# Patient Record
Sex: Male | Born: 1975 | Race: Black or African American | Hispanic: No | Marital: Married | Smoking: Never smoker
Health system: Southern US, Community
[De-identification: ages and names within clinical notes are randomized; demographics above are authoritative.]

## PROBLEM LIST (undated history)

## (undated) DIAGNOSIS — Z940 Kidney transplant status: Secondary | ICD-10-CM

## (undated) DIAGNOSIS — D696 Thrombocytopenia, unspecified: Secondary | ICD-10-CM

## (undated) DIAGNOSIS — N186 End stage renal disease: Secondary | ICD-10-CM

## (undated) DIAGNOSIS — I10 Essential (primary) hypertension: Secondary | ICD-10-CM

## (undated) DIAGNOSIS — Z992 Dependence on renal dialysis: Secondary | ICD-10-CM

## (undated) DIAGNOSIS — I770 Arteriovenous fistula, acquired: Secondary | ICD-10-CM

## (undated) DIAGNOSIS — M9080 Osteopathy in diseases classified elsewhere, unspecified site: Secondary | ICD-10-CM

## (undated) DIAGNOSIS — N189 Chronic kidney disease, unspecified: Secondary | ICD-10-CM

## (undated) DIAGNOSIS — N19 Unspecified kidney failure: Secondary | ICD-10-CM

## (undated) DIAGNOSIS — D631 Anemia in chronic kidney disease: Secondary | ICD-10-CM

## (undated) DIAGNOSIS — Z973 Presence of spectacles and contact lenses: Secondary | ICD-10-CM

## (undated) DIAGNOSIS — N039 Chronic nephritic syndrome with unspecified morphologic changes: Secondary | ICD-10-CM

## (undated) DIAGNOSIS — M898X9 Other specified disorders of bone, unspecified site: Secondary | ICD-10-CM

## (undated) DIAGNOSIS — E213 Hyperparathyroidism, unspecified: Secondary | ICD-10-CM

## (undated) DIAGNOSIS — N185 Chronic kidney disease, stage 5: Secondary | ICD-10-CM

## (undated) DIAGNOSIS — E889 Metabolic disorder, unspecified: Secondary | ICD-10-CM

## (undated) HISTORY — DX: Thrombocytopenia, unspecified: D69.6

## (undated) HISTORY — DX: Chronic nephritic syndrome with unspecified morphologic changes: N03.9

## (undated) HISTORY — DX: Anemia in chronic kidney disease: D63.1

## (undated) HISTORY — DX: Hyperparathyroidism, unspecified: E21.3

## (undated) HISTORY — DX: Unspecified kidney failure: N19

## (undated) HISTORY — DX: Arteriovenous fistula, acquired: I77.0

## (undated) HISTORY — DX: Chronic kidney disease, unspecified: N18.9

## (undated) HISTORY — DX: Essential (primary) hypertension: I10

## (undated) HISTORY — PX: SHOULDER SURGERY: SHX246

## (undated) HISTORY — DX: Kidney transplant status: Z94.0

## (undated) HISTORY — PX: NEPHRECTOMY TRANSPLANTED ORGAN: SUR880

## (undated) HISTORY — DX: Other specified disorders of bone, unspecified site: M89.8X9

## (undated) HISTORY — DX: Chronic kidney disease, stage 5: N18.5

## (undated) HISTORY — DX: End stage renal disease: N18.6

## (undated) HISTORY — DX: Osteopathy in diseases classified elsewhere, unspecified site: M90.80

## (undated) HISTORY — PX: WISDOM TOOTH EXTRACTION: SHX21

## (undated) HISTORY — DX: Metabolic disorder, unspecified: E88.9

## (undated) HISTORY — DX: Dependence on renal dialysis: Z99.2

---

## 2003-02-27 ENCOUNTER — Emergency Department (HOSPITAL_COMMUNITY): Admission: EM | Admit: 2003-02-27 | Discharge: 2003-02-28 | Payer: Self-pay | Admitting: Emergency Medicine

## 2006-08-24 ENCOUNTER — Encounter: Admission: RE | Admit: 2006-08-24 | Discharge: 2006-08-24 | Payer: Self-pay | Admitting: Family Medicine

## 2009-02-26 ENCOUNTER — Encounter: Admission: RE | Admit: 2009-02-26 | Discharge: 2009-02-26 | Payer: Self-pay | Admitting: Nephrology

## 2009-05-03 ENCOUNTER — Encounter: Payer: Self-pay | Admitting: Cardiovascular Disease

## 2009-05-03 ENCOUNTER — Ambulatory Visit: Payer: Self-pay

## 2009-05-03 DIAGNOSIS — N185 Chronic kidney disease, stage 5: Secondary | ICD-10-CM | POA: Insufficient documentation

## 2009-05-03 HISTORY — DX: Chronic kidney disease, stage 5: N18.5

## 2009-11-01 ENCOUNTER — Ambulatory Visit: Payer: Self-pay | Admitting: Surgery

## 2010-04-10 ENCOUNTER — Encounter: Payer: Self-pay | Admitting: Family Medicine

## 2010-04-21 NOTE — Miscellaneous (Signed)
Summary: Orders Update  Clinical Lists Changes  Problems: Added new problem of RENAL DISEASE, CHRONIC, STAGE IV (ICD-585.4) Orders: Added new Test order of Renal Artery Duplex (Renal Artery Duplex) - Signed

## 2010-08-02 NOTE — Procedures (Signed)
CEPHALIC VEIN MAPPING   INDICATION:  End-stage renal disease.   HISTORY:   EXAM:  The right cephalic vein is compressible.   Diameter measurements range from 0.27 cm to 0.39 cm.   The right basilic vein is compressible.   Diameter measurements range from 0.36 to 0.52 cm.   The left cephalic vein is compressible.   Diameter measurements range from 0.36 cm to 0.52 cm.   The left basilic vein is compressible.   Diameter measurements range from 0.30 cm to 0.54 cm.   See attached worksheet for all measurements.   IMPRESSION:  Patient's bilateral cephalic veins and basilic veins are  noted with the diameters above.   ___________________________________________  V. Leia Alf, MD   CB/MEDQ  D:  11/01/2009  T:  11/01/2009  Job:  [id number]

## 2010-08-02 NOTE — Assessment & Plan Note (Signed)
OFFICE VISIT   Charles [name], Charles [name]  DOB:  [birth year]                                       11/01/2009  [id number]   REASON FOR VISIT:  Discussion for dialysis access.   HISTORY:  This a 35 year old gentleman I have seen at the request of Dr.  Posey Pronto for evaluation of dialysis access.  Patient has stage IV kidney  disease secondary to hypertension.  Renal complications have been  metabolic bone disease as well as anemia, both of which are medically  managed.  The patient is right-handed.   REVIEW OF SYSTEMS:  GENERAL:  Negative chills, weight gain, weight loss.  VASCULAR:  Negative.  CARDIAC:  Negative.  GI:  Negative.  NEURO:  Negative.  PULMONARY:  Negative.  HEME:  Negative.  GU:  Positive for renal disease.  ENT:  Negative.  MUSCULOSKELETAL:  Negative.  LYMPHATICS:  Negative.  SKIN:  Negative.   PAST MEDICAL HISTORY:  Hypertension, chronic kidney disease, metabolic  bone disease and anemia.   PAST SURGICAL HISTORY:  Shoulder surgery in 1992 and 19/94.   SOCIAL HISTORY:  He is married with 4 children.  Works in [employer], research (medical).  He does not smoke or drink.   FAMILY HISTORY:  Negative for cardiovascular disease.   ALLERGIES:  Verapamil.   PHYSICAL EXAMINATION:  Heart rate is 52, blood pressure 188/112,  temperature is 97.5.  General:  Well-appearing in no distress.  HEENT:  Within normal limits.  Respirations nonlabored.  Cardiovascular:  Patient has a palpable left radial pulse.  MUSCULOSKELETAL:  Without  major deformity.  Neurologic:  No focal deficits or weakness.  Skin:  Without rash.   DIAGNOSTICS:  I have ordered and independently reviewed his vein  mapping.  He has excellent cephalic and basilic veins bilaterally.   ASSESSMENT/PLAN:  Chronic kidney disease needing permanent access.  I  discussed at length today proceeding with left radiocephalic fistula.  We talked about nonmaturity.  We talked about the risk of steal  syndrome.  The patient  wishes to go back to Dr. Posey Pronto, about the timing  of which his operation needs to occur.  He is under the understanding  that at 20% function that he can go many years at his age without  requiring dialysis.  I told I would be happy to assist with him in any  way I could.  He will need to call my office, after his meeting with Dr.  Posey Pronto to schedule either a follow-up or left radiocephalic fistula.     Eldridge Abrahams, MD  Electronically Signed   VWB/MEDQ  D:  11/01/2009  T:  11/02/2009  Job:  [id number]   cc:   Elmarie Shiley, MD

## 2011-06-29 ENCOUNTER — Other Ambulatory Visit: Payer: Self-pay

## 2011-06-29 DIAGNOSIS — N185 Chronic kidney disease, stage 5: Secondary | ICD-10-CM

## 2011-06-29 DIAGNOSIS — Z01810 Encounter for preprocedural cardiovascular examination: Secondary | ICD-10-CM

## 2011-06-29 DIAGNOSIS — N184 Chronic kidney disease, stage 4 (severe): Secondary | ICD-10-CM

## 2011-07-04 ENCOUNTER — Ambulatory Visit (HOSPITAL_COMMUNITY): Admission: RE | Admit: 2011-07-04 | Payer: Self-pay | Source: Ambulatory Visit

## 2011-07-04 ENCOUNTER — Ambulatory Visit (HOSPITAL_COMMUNITY): Admission: RE | Admit: 2011-07-04 | Payer: BC Managed Care – PPO | Source: Ambulatory Visit

## 2011-07-12 ENCOUNTER — Encounter: Payer: Self-pay | Admitting: Surgery

## 2011-07-20 ENCOUNTER — Encounter: Payer: Self-pay | Admitting: Surgery

## 2011-07-24 ENCOUNTER — Ambulatory Visit: Payer: BC Managed Care – PPO | Admitting: Surgery

## 2011-07-30 ENCOUNTER — Inpatient Hospital Stay (HOSPITAL_COMMUNITY)
Admission: EM | Admit: 2011-07-30 | Discharge: 2011-08-03 | DRG: 315 | Disposition: A | Discharge status: Home / Self Care | Payer: BC Managed Care – PPO | Attending: Internal Medicine | Admitting: Internal Medicine

## 2011-07-30 ENCOUNTER — Encounter (HOSPITAL_COMMUNITY): Payer: Self-pay

## 2011-07-30 DIAGNOSIS — Z9119 Patient's noncompliance with other medical treatment and regimen: Secondary | ICD-10-CM

## 2011-07-30 DIAGNOSIS — N2581 Secondary hyperparathyroidism of renal origin: Secondary | ICD-10-CM | POA: Diagnosis present

## 2011-07-30 DIAGNOSIS — N19 Unspecified kidney failure: Secondary | ICD-10-CM

## 2011-07-30 DIAGNOSIS — M9080 Osteopathy in diseases classified elsewhere, unspecified site: Secondary | ICD-10-CM

## 2011-07-30 DIAGNOSIS — N185 Chronic kidney disease, stage 5: Secondary | ICD-10-CM | POA: Diagnosis present

## 2011-07-30 DIAGNOSIS — D696 Thrombocytopenia, unspecified: Secondary | ICD-10-CM

## 2011-07-30 DIAGNOSIS — I120 Hypertensive chronic kidney disease with stage 5 chronic kidney disease or end stage renal disease: Principal | ICD-10-CM | POA: Diagnosis present

## 2011-07-30 DIAGNOSIS — N186 End stage renal disease: Secondary | ICD-10-CM

## 2011-07-30 DIAGNOSIS — Z888 Allergy status to other drugs, medicaments and biological substances status: Secondary | ICD-10-CM

## 2011-07-30 DIAGNOSIS — E872 Acidosis, unspecified: Secondary | ICD-10-CM

## 2011-07-30 DIAGNOSIS — Z833 Family history of diabetes mellitus: Secondary | ICD-10-CM

## 2011-07-30 DIAGNOSIS — Z91199 Patient's noncompliance with other medical treatment and regimen due to unspecified reason: Secondary | ICD-10-CM

## 2011-07-30 DIAGNOSIS — Z8249 Family history of ischemic heart disease and other diseases of the circulatory system: Secondary | ICD-10-CM

## 2011-07-30 DIAGNOSIS — E889 Metabolic disorder, unspecified: Secondary | ICD-10-CM

## 2011-07-30 DIAGNOSIS — N189 Chronic kidney disease, unspecified: Secondary | ICD-10-CM

## 2011-07-30 DIAGNOSIS — D631 Anemia in chronic kidney disease: Secondary | ICD-10-CM | POA: Diagnosis present

## 2011-07-30 DIAGNOSIS — M898X9 Other specified disorders of bone, unspecified site: Secondary | ICD-10-CM

## 2011-07-30 DIAGNOSIS — I10 Essential (primary) hypertension: Secondary | ICD-10-CM | POA: Diagnosis present

## 2011-07-30 DIAGNOSIS — E8729 Other acidosis: Secondary | ICD-10-CM

## 2011-07-30 DIAGNOSIS — N184 Chronic kidney disease, stage 4 (severe): Secondary | ICD-10-CM

## 2011-07-30 HISTORY — DX: Anemia in chronic kidney disease: D63.1

## 2011-07-30 HISTORY — DX: Anemia in chronic kidney disease: N18.9

## 2011-07-30 HISTORY — DX: Unspecified kidney failure: N19

## 2011-07-30 LAB — CBC
HCT: 35.4 % — ABNORMAL LOW (ref 39.0–52.0)
Hemoglobin: 12.8 g/dL — ABNORMAL LOW (ref 13.0–17.0)
MCH: 29.2 pg (ref 26.0–34.0)
MCHC: 36.2 g/dL — ABNORMAL HIGH (ref 30.0–36.0)
MCV: 80.8 fL (ref 78.0–100.0)
Platelets: 94 10*3/uL — ABNORMAL LOW (ref 150–400)
RBC: 4.38 MIL/uL (ref 4.22–5.81)
RDW: 13.1 % (ref 11.5–15.5)
WBC: 12.2 10*3/uL — ABNORMAL HIGH (ref 4.0–10.5)

## 2011-07-30 LAB — URINALYSIS, ROUTINE W REFLEX MICROSCOPIC
Bilirubin Urine: NEGATIVE
Glucose, UA: NEGATIVE mg/dL
Ketones, ur: NEGATIVE mg/dL
Leukocytes, UA: NEGATIVE
Nitrite: NEGATIVE
Protein, ur: 100 mg/dL — AB
Specific Gravity, Urine: 1.015 (ref 1.005–1.030)
Urobilinogen, UA: 0.2 mg/dL (ref 0.0–1.0)
pH: 5 (ref 5.0–8.0)

## 2011-07-30 LAB — DIFFERENTIAL
Basophils Absolute: 0 10*3/uL (ref 0.0–0.1)
Basophils Relative: 0 % (ref 0–1)
Eosinophils Absolute: 0.1 10*3/uL (ref 0.0–0.7)
Eosinophils Relative: 1 % (ref 0–5)
Lymphocytes Relative: 12 % (ref 12–46)
Lymphs Abs: 1.5 10*3/uL (ref 0.7–4.0)
Monocytes Absolute: 1.1 10*3/uL — ABNORMAL HIGH (ref 0.1–1.0)
Monocytes Relative: 9 % (ref 3–12)
Neutro Abs: 9.5 10*3/uL — ABNORMAL HIGH (ref 1.7–7.7)
Neutrophils Relative %: 78 % — ABNORMAL HIGH (ref 43–77)

## 2011-07-30 LAB — COMPREHENSIVE METABOLIC PANEL
ALT: 8 U/L (ref 0–53)
AST: 16 U/L (ref 0–37)
Albumin: 3 g/dL — ABNORMAL LOW (ref 3.5–5.2)
Alkaline Phosphatase: 60 U/L (ref 39–117)
BUN: 117 mg/dL — ABNORMAL HIGH (ref 6–23)
CO2: 16 mEq/L — ABNORMAL LOW (ref 19–32)
Calcium: 8.3 mg/dL — ABNORMAL LOW (ref 8.4–10.5)
Chloride: 100 mEq/L (ref 96–112)
Creatinine, Ser: 10.23 mg/dL — ABNORMAL HIGH (ref 0.50–1.35)
GFR calc Af Amer: 7 mL/min — ABNORMAL LOW (ref >90)
GFR calc non Af Amer: 6 mL/min — ABNORMAL LOW (ref >90)
Glucose, Bld: 92 mg/dL (ref 70–99)
Potassium: 3.9 mEq/L (ref 3.5–5.1)
Sodium: 134 mEq/L — ABNORMAL LOW (ref 135–145)
Total Bilirubin: 0.5 mg/dL (ref 0.3–1.2)
Total Protein: 6 g/dL (ref 6.0–8.3)

## 2011-07-30 LAB — URINE MICROSCOPIC-ADD ON

## 2011-07-30 LAB — MRSA PCR SCREENING: MRSA by PCR: NEGATIVE

## 2011-07-30 LAB — LIPASE, BLOOD: Lipase: 82 U/L — ABNORMAL HIGH (ref 11–59)

## 2011-07-30 MED ORDER — TORSEMIDE 20 MG PO TABS
20.0000 mg | ORAL_TABLET | Freq: Every day | ORAL | Status: DC
  Administered 2011-07-30 – 2011-07-31 (×2): 20 mg via ORAL
  Filled 2011-07-30 (×3): qty 1

## 2011-07-30 MED ORDER — CLONIDINE HCL 0.2 MG PO TABS
0.2000 mg | ORAL_TABLET | Freq: Two times a day (BID) | ORAL | Status: DC
  Administered 2011-07-30 – 2011-07-31 (×3): 0.2 mg via ORAL
  Filled 2011-07-30 (×5): qty 1

## 2011-07-30 MED ORDER — ACETAMINOPHEN 325 MG PO TABS
650.0000 mg | ORAL_TABLET | Freq: Four times a day (QID) | ORAL | Status: DC | PRN
  Administered 2011-07-31: 650 mg via ORAL
  Filled 2011-07-30: qty 2

## 2011-07-30 MED ORDER — OMEGA-3 FATTY ACIDS 1000 MG PO CAPS: 1.0000 g | ORAL_CAPSULE | Freq: Every day | ORAL | Status: DC

## 2011-07-30 MED ORDER — HEPARIN SODIUM (PORCINE) 5000 UNIT/ML IJ SOLN
5000.0000 [IU] | Freq: Three times a day (TID) | INTRAMUSCULAR | Status: DC
  Administered 2011-07-30 – 2011-08-02 (×7): 5000 [IU] via SUBCUTANEOUS
  Filled 2011-07-30 (×15): qty 1

## 2011-07-30 MED ORDER — AMLODIPINE BESYLATE 10 MG PO TABS
10.0000 mg | ORAL_TABLET | Freq: Every day | ORAL | Status: DC
  Administered 2011-07-30 – 2011-08-02 (×4): 10 mg via ORAL
  Filled 2011-07-30 (×5): qty 1

## 2011-07-30 MED ORDER — MORPHINE SULFATE 4 MG/ML IJ SOLN
4.0000 mg | Freq: Once | INTRAMUSCULAR | Status: AC
  Administered 2011-07-30: 4 mg via INTRAVENOUS
  Filled 2011-07-30: qty 1

## 2011-07-30 MED ORDER — CALCIUM CARBONATE-VITAMIN D 600-200 MG-UNIT PO TABS: 1.0000 | ORAL_TABLET | Freq: Every day | ORAL | Status: DC

## 2011-07-30 MED ORDER — NEBIVOLOL HCL 20 MG PO TABS: 20.0000 mg | ORAL_TABLET | Freq: Every day | ORAL | Status: DC

## 2011-07-30 MED ORDER — HYDRALAZINE HCL 20 MG/ML IJ SOLN
10.0000 mg | INTRAMUSCULAR | Status: DC | PRN
  Filled 2011-07-30: qty 0.5

## 2011-07-30 MED ORDER — ONDANSETRON HCL 4 MG/2ML IJ SOLN: 4.0000 mg | Freq: Four times a day (QID) | INTRAMUSCULAR | Status: DC | PRN

## 2011-07-30 MED ORDER — NEBIVOLOL HCL 10 MG PO TABS
20.0000 mg | ORAL_TABLET | Freq: Every day | ORAL | Status: DC
  Filled 2011-07-30: qty 2

## 2011-07-30 MED ORDER — AMLODIPINE BESYLATE 10 MG PO TABS
10.0000 mg | ORAL_TABLET | Freq: Every day | ORAL | Status: DC
  Filled 2011-07-30: qty 1

## 2011-07-30 MED ORDER — RENA-VITE PO TABS
1.0000 | ORAL_TABLET | Freq: Every day | ORAL | Status: DC
  Administered 2011-07-30 – 2011-07-31 (×2): 1 via ORAL
  Filled 2011-07-30 (×2): qty 1

## 2011-07-30 MED ORDER — NEBIVOLOL HCL 10 MG PO TABS
20.0000 mg | ORAL_TABLET | Freq: Every day | ORAL | Status: DC
  Administered 2011-07-30 – 2011-08-02 (×4): 20 mg via ORAL
  Filled 2011-07-30 (×6): qty 2

## 2011-07-30 MED ORDER — SODIUM CHLORIDE 0.9 % IV SOLN: Freq: Once | INTRAVENOUS | Status: DC

## 2011-07-30 MED ORDER — FERROUS SULFATE 325 (65 FE) MG PO TABS
325.0000 mg | ORAL_TABLET | Freq: Every day | ORAL | Status: DC
  Filled 2011-07-30: qty 1

## 2011-07-30 MED ORDER — SODIUM CHLORIDE 0.9 % IJ SOLN: 3.0000 mL | INTRAMUSCULAR | Status: DC | PRN

## 2011-07-30 MED ORDER — PARICALCITOL 1 MCG PO CAPS
1.0000 ug | ORAL_CAPSULE | Freq: Every day | ORAL | Status: DC
  Administered 2011-07-30 – 2011-07-31 (×2): 1 ug via ORAL
  Filled 2011-07-30 (×4): qty 1

## 2011-07-30 MED ORDER — MINOXIDIL 10 MG PO TABS
10.0000 mg | ORAL_TABLET | Freq: Two times a day (BID) | ORAL | Status: DC
  Administered 2011-07-30 – 2011-07-31 (×3): 10 mg via ORAL
  Filled 2011-07-30 (×5): qty 1

## 2011-07-30 MED ORDER — SODIUM CHLORIDE 0.9 % IJ SOLN
3.0000 mL | Freq: Two times a day (BID) | INTRAMUSCULAR | Status: DC
  Administered 2011-07-30 – 2011-08-02 (×7): 3 mL via INTRAVENOUS

## 2011-07-30 MED ORDER — ONDANSETRON HCL 4 MG/2ML IJ SOLN
4.0000 mg | Freq: Once | INTRAMUSCULAR | Status: AC
  Administered 2011-07-30: 4 mg via INTRAVENOUS
  Filled 2011-07-30: qty 2

## 2011-07-30 MED ORDER — LABETALOL HCL 5 MG/ML IV SOLN
10.0000 mg | INTRAVENOUS | Status: DC | PRN
  Administered 2011-07-30 (×4): 10 mg via INTRAVENOUS
  Filled 2011-07-30 (×5): qty 4

## 2011-07-30 MED ORDER — VITAMIN D (ERGOCALCIFEROL) 1.25 MG (50000 UNIT) PO CAPS: 50000.0000 [IU] | ORAL_CAPSULE | ORAL | Status: DC

## 2011-07-30 MED ORDER — CALCIUM ACETATE 667 MG PO CAPS
1334.0000 mg | ORAL_CAPSULE | Freq: Three times a day (TID) | ORAL | Status: DC
  Administered 2011-07-31 – 2011-08-03 (×7): 1334 mg via ORAL
  Filled 2011-07-30 (×14): qty 2

## 2011-07-30 MED ORDER — CALCIUM CARBONATE-VITAMIN D 500-200 MG-UNIT PO TABS
1.0000 | ORAL_TABLET | Freq: Every day | ORAL | Status: DC
  Filled 2011-07-30: qty 1

## 2011-07-30 MED ORDER — ONDANSETRON HCL 4 MG PO TABS: 4.0000 mg | ORAL_TABLET | Freq: Four times a day (QID) | ORAL | Status: DC | PRN

## 2011-07-30 MED ORDER — MORPHINE SULFATE 2 MG/ML IJ SOLN
1.0000 mg | INTRAMUSCULAR | Status: DC | PRN
  Administered 2011-07-30: 2 mg via INTRAVENOUS
  Filled 2011-07-30: qty 1

## 2011-07-30 MED ORDER — SODIUM CHLORIDE 0.9 % IV SOLN: 250.0000 mL | INTRAVENOUS | Status: DC | PRN

## 2011-07-30 MED ORDER — SODIUM CHLORIDE 0.9 % IV BOLUS (SEPSIS)
1000.0000 mL | Freq: Once | INTRAVENOUS | Status: AC
  Administered 2011-07-30: 1000 mL via INTRAVENOUS

## 2011-07-30 MED ORDER — OMEGA-3-ACID ETHYL ESTERS 1 G PO CAPS
1.0000 g | ORAL_CAPSULE | Freq: Every day | ORAL | Status: DC
  Administered 2011-07-30 – 2011-08-03 (×5): 1 g via ORAL
  Filled 2011-07-30 (×6): qty 1

## 2011-07-30 MED ORDER — LABETALOL HCL 5 MG/ML IV SOLN
20.0000 mg | Freq: Once | INTRAVENOUS | Status: AC
  Administered 2011-07-30: 20 mg via INTRAVENOUS
  Filled 2011-07-30: qty 4

## 2011-07-30 MED ORDER — IRON PO TABS: 1.0000 | ORAL_TABLET | Freq: Every day | ORAL | Status: DC

## 2011-07-30 MED ORDER — ACETAMINOPHEN 650 MG RE SUPP: 650.0000 mg | Freq: Four times a day (QID) | RECTAL | Status: DC | PRN

## 2011-07-30 MED ORDER — MINOXIDIL 10 MG PO TABS
10.0000 mg | ORAL_TABLET | Freq: Every day | ORAL | Status: DC
  Filled 2011-07-30: qty 1

## 2011-07-30 NOTE — ED Notes (Signed)
Generalized abdominal pain began a few days ago.  Has nausea and has vomited X1,  Denies any hematuria or dysuria.

## 2011-07-30 NOTE — ED Provider Notes (Signed)
History     CSN: [id number]  Arrival date & time 07/30/11  4   First MD Initiated Contact with Patient 07/30/11 1227      Chief Complaint  Patient presents with  . Abdominal Pain  . Hypertension    (Consider location/radiation/quality/duration/timing/severity/associated sxs/prior treatment) HPI History from patient. 36 year old male with history of chronic kidney disease presents with abdominal pain. States it started 3 days ago. Pain is described as dull in nature, is constant, and is located to the epigastrium. No known aggravating/alleviating factors. No treatment at home. He has had associated nausea, vomiting, diarrhea, reduced appetite. Emesis has been nonbilious and nonbloody. Denies fever or chills. Has never had anything like this before. No history of abdominal surgeries. Denies etoh use.  He is followed by Dr. Posey Pronto for his CKD; last Cr was ~7. Not currently on dialysis.  Has hypertension as well. On clonidine, amlodipine, nebivolol. Was only able to take clonidine this morning 2/2 nausea and vomiting. Denies any dizziness, visual changes, headache at this time.  Past Medical History  Diagnosis Date  . Hypertension   . Chronic kidney disease   . Metabolic bone disease   . Anemia in chronic kidney disease     History reviewed. No pertinent past surgical history.  No family history on file.  History  Substance Use Topics  . Smoking status: Never Smoker   . Smokeless tobacco: Not on file  . Alcohol Use: No      Review of Systems  Constitutional: Positive for appetite change. Negative for fever and chills.  Respiratory: Negative for cough and shortness of breath.   Cardiovascular: Negative for chest pain.  Gastrointestinal: Positive for nausea, vomiting, abdominal pain and diarrhea.  Genitourinary: Negative for dysuria and decreased urine volume.  Musculoskeletal: Negative for myalgias.  Skin: Negative for color change and rash.  All other systems  reviewed and are negative.    Allergies  Verapamil  Home Medications   Current Outpatient Rx  Name Route Sig Dispense Refill  . AMLODIPINE BESYLATE 10 MG PO TABS Oral Take 10 mg by mouth daily.    Marland Kitchen CALCIUM 600+D PO Oral Take 1 tablet by mouth daily.     Marland Kitchen CLONIDINE HCL 0.2 MG PO TABS Oral Take 0.2 mg by mouth 2 (two) times daily.    . OMEGA-3 FATTY ACIDS 1000 MG PO CAPS Oral Take 1 g by mouth daily.    Marland Kitchen MINOXIDIL 10 MG PO TABS Oral Take 10 mg by mouth daily.    . NEBIVOLOL HCL 20 MG PO TABS Oral Take 20 mg by mouth daily.     Marland Kitchen PARICALCITOL 1 MCG PO CAPS Oral Take 1 mcg by mouth daily.    . TORSEMIDE 20 MG PO TABS Oral Take 20 mg by mouth daily.    Marland Kitchen VITAMIN D (ERGOCALCIFEROL) 50000 UNITS PO CAPS Oral Take 50,000 Units by mouth every 7 (seven) days. Day of the week varies - last week Tuesday    . IRON PO TABS Oral Take 1 tablet by mouth daily.       BP 225/138  Pulse 94  Temp(Src) 98.2 F (36.8 C) (Oral)  Resp 18  SpO2 100%  Physical Exam  Nursing note and vitals reviewed. Constitutional: He appears well-developed and well-nourished. No distress.       Pt hypertensive; 250/150 on monitor  HENT:  Head: Normocephalic and atraumatic.  Eyes: EOM are normal. Pupils are equal, round, and reactive to light.  Neck: Normal range of  motion.  Cardiovascular: Normal rate, regular rhythm and normal heart sounds.  Exam reveals no gallop and no friction rub.   No murmur heard. Pulmonary/Chest: Effort normal and breath sounds normal. He exhibits no tenderness.  Abdominal: Soft. Bowel sounds are normal.       TTP in epigastrium without rebound or guard  Musculoskeletal: Normal range of motion. He exhibits no edema.  Neurological: He is alert.  Skin: Skin is warm and dry. He is not diaphoretic.  Psychiatric: He has a normal mood and affect.    ED Course  Procedures (including critical care time)  Labs Reviewed  CBC - Abnormal; Notable for the following:    WBC 12.2 (*)     Hemoglobin 12.8 (*)    HCT 35.4 (*)    MCHC 36.2 (*)    Platelets 94 (*) PLATELET COUNT CONFIRMED BY SMEAR   All other components within normal limits  COMPREHENSIVE METABOLIC PANEL - Abnormal; Notable for the following:    Sodium 134 (*)    CO2 16 (*)    BUN 117 (*)    Creatinine, Ser 10.23 (*)    Calcium 8.3 (*)    Albumin 3.0 (*)    GFR calc non Af Amer 6 (*)    GFR calc Af Amer 7 (*)    All other components within normal limits  LIPASE, BLOOD - Abnormal; Notable for the following:    Lipase 82 (*)    All other components within normal limits  URINALYSIS, ROUTINE W REFLEX MICROSCOPIC - Abnormal; Notable for the following:    Hgb urine dipstick MODERATE (*)    Protein, ur 100 (*)    All other components within normal limits  DIFFERENTIAL  URINE MICROSCOPIC-ADD ON   No results found.   1. RENAL DISEASE, CHRONIC, STAGE IV   2. Metabolic acidosis   3. Hypertension       MDM  Patient with chronic kidney disease presents with abdominal pain. Exam significant for mild tenderness to epigastrium. Labs significant for significantly elevated BUN/creatinine above his baseline and metabolic acidosis without anion gap. Lipase mildly elevated. Given this, we'll plan to admit him. Findings discussed with him and he was agreeable with this plan. He is feeling more comfortable at this time with pain medication. I spoke with the resident with [facility], who agrees to see and admit the patient.        Abran Richard, [place] 07/30/11 1356

## 2011-07-30 NOTE — Consult Note (Signed)
Reason for Consult: CKD 5 Referring Physician: Dr. Bonney Aid Charles is an 36 y.o. [name].  HPI: 36 yr old [name].  Has not gotten vasc access placed despite multiple counseling sessions.  Now with N, V, and stomach discomfort.  Admits itching, cramps, but not recent.  Not SOB or CP.  No FH of renal disease.  Admitted with uremia and BP out of control. ROS Denies HA No visual changes or hearing abn Now prob swallowing Has loose stools Skin no rashes No hx Hepatitis No PND or orthop or CP No joint C/O No numbness or tingling or focal weakness No dysuria or hematuria  . Primary Nephrologist Charles [name]. . . Access none.  Past Medical History  Diagnosis Date  . Hypertension   . Chronic kidney disease   . Metabolic bone disease   . Anemia in chronic kidney disease     Past Surgical History  Procedure Date  . Shoulder surgery     20 years ago    Family History  Problem Relation Age of Onset  . Hypertension Mother   . Hypertension Father   . Diabetes Mother     Social History:  reports that he has never smoked. He does not have any smokeless tobacco history on file. He reports that he does not drink alcohol or use illicit drugs.  Allergies:  Allergies  Allergen Reactions  . Verapamil Other (See Comments)    Pt not sure why we have this listed    Medications: I have reviewed the patient's current medications. And made changes anticipating need for HD   Results for orders placed during the hospital encounter of 07/30/11 (from the past 48 hour(s))  CBC     Status: Abnormal   Collection Time   07/30/11 12:44 PM      Component Value Range Comment   WBC 12.2 (*) 4.0 - 10.5 (K/uL)    RBC 4.38  4.22 - 5.81 (MIL/uL)    Hemoglobin 12.8 (*) 13.0 - 17.0 (g/dL)    HCT 35.4 (*) 39.0 - 52.0 (%)    MCV 80.8  78.0 - 100.0 (fL)    MCH 29.2  26.0 - 34.0 (pg)    MCHC 36.2 (*) 30.0 - 36.0 (g/dL)    RDW 13.1  11.5 - 15.5 (%)    Platelets 94 (*)  150 - 400 (K/uL) PLATELET COUNT CONFIRMED BY SMEAR  DIFFERENTIAL     Status: Abnormal   Collection Time   07/30/11 12:44 PM      Component Value Range Comment   Neutrophils Relative 78 (*) 43 - 77 (%)    Lymphocytes Relative 12  12 - 46 (%)    Monocytes Relative 9  3 - 12 (%)    Eosinophils Relative 1  0 - 5 (%)    Basophils Relative 0  0 - 1 (%)    Neutro Abs 9.5 (*) 1.7 - 7.7 (K/uL)    Lymphs Abs 1.5  0.7 - 4.0 (K/uL)    Monocytes Absolute 1.1 (*) 0.1 - 1.0 (K/uL)    Eosinophils Absolute 0.1  0.0 - 0.7 (K/uL)    Basophils Absolute 0.0  0.0 - 0.1 (K/uL)   COMPREHENSIVE METABOLIC PANEL     Status: Abnormal   Collection Time   07/30/11 12:44 PM      Component Value Range Comment   Sodium 134 (*) 135 - 145 (mEq/L)    Potassium 3.9  3.5 - 5.1 (mEq/L)  Chloride 100  96 - 112 (mEq/L)    CO2 16 (*) 19 - 32 (mEq/L)    Glucose, Bld 92  70 - 99 (mg/dL)    BUN 117 (*) 6 - 23 (mg/dL)    Creatinine, Ser 10.23 (*) 0.50 - 1.35 (mg/dL)    Calcium 8.3 (*) 8.4 - 10.5 (mg/dL)    Total Protein 6.0  6.0 - 8.3 (g/dL)    Albumin 3.0 (*) 3.5 - 5.2 (g/dL)    AST 16  0 - 37 (U/L)    ALT 8  0 - 53 (U/L)    Alkaline Phosphatase 60  39 - 117 (U/L)    Total Bilirubin 0.5  0.3 - 1.2 (mg/dL)    GFR calc non Af Amer 6 (*) >90 (mL/min)    GFR calc Af Amer 7 (*) >90 (mL/min)   LIPASE, BLOOD     Status: Abnormal   Collection Time   07/30/11 12:44 PM      Component Value Range Comment   Lipase 82 (*) 11 - 59 (U/L)   URINALYSIS, ROUTINE W REFLEX MICROSCOPIC     Status: Abnormal   Collection Time   07/30/11  1:03 PM      Component Value Range Comment   Color, Urine YELLOW  YELLOW     APPearance CLEAR  CLEAR     Specific Gravity, Urine 1.015  1.005 - 1.030     pH 5.0  5.0 - 8.0     Glucose, UA NEGATIVE  NEGATIVE (mg/dL)    Hgb urine dipstick MODERATE (*) NEGATIVE     Bilirubin Urine NEGATIVE  NEGATIVE     Ketones, ur NEGATIVE  NEGATIVE (mg/dL)    Protein, ur 100 (*) NEGATIVE (mg/dL)    Urobilinogen, UA  0.2  0.0 - 1.0 (mg/dL)    Nitrite NEGATIVE  NEGATIVE     Leukocytes, UA NEGATIVE  NEGATIVE    URINE MICROSCOPIC-ADD ON     Status: Normal   Collection Time   07/30/11  1:03 PM      Component Value Range Comment   RBC / HPF 3-6  <3 (RBC/hpf)     No results found.  @ROS @ Blood pressure 246/141, pulse 77, temperature 98.3 F (36.8 C), temperature source Oral, resp. rate 10, height 5\' 9"  (1.753 m), weight 98.6 kg (217 lb 6 oz), SpO2 98.00%. @PHYSEXAMBYAGE2 @ Physical Examination: General appearance - alert, well appearing, and in no distress and anxious Mental status - alert, oriented to person, place, and time, anxious Eyes - funduscopic exam abnormal silver wiring, av nicking Mouth - mucous membranes moist, pharynx normal without lesions Neck - adenopathy noted PCL Lymphatics - posterior cervical nodes Chest - clear to auscultation, no wheezes, rales or rhonchi, symmetric air entry Heart - normal rate, regular rhythm, normal S1, S2, no murmurs, rubs, clicks or gallops, S1 and S2 normal, S4 present Abdomen - tenderness noted diffusely hepatomegaly Liver down 5 cm Musculoskeletal - no joint tenderness, deformity or swelling Extremities - pedal edema 1 + Skin - dry  Assessment/Plan: 1 CKD 5 with uremia, mild vol xs, HTN.  Needs perm access and Perm cath.  Education done and questions answered  Adherence an issue.  Will get [facility] to see and get both procedures done. Vein map prior 2 HTN time meds for HD.  Lower vol at HD. Cr will rise with control 3 HPTH check leve 4 Anemia mild no tx indic 5 Adherence P Vein map, adjust timing and get meds for HD.  Educate, place access  Charles [name] L 07/30/2011, 7:03 PM

## 2011-07-30 NOTE — H&P (Signed)
Hospital Admission Note Date: 07/30/2011  Patient name: Charles [name] Medical record number: [id number] Date of birth: 10/23/1975 Age: 36 y.o. Gender: male PCP: No primary provider on file.  Medical Service: [facility] teaching service  Attending physician: Dr. Lars Mage    1st Contact: Dr. Nicoletta Dress               [phone] 2nd Contact: Dr. Posey Pronto    [id number]  After 5 pm or weekends: 1st Contact:      Pager: [phone] 2nd Contact:      Pager: [phone]  Chief Complaint: Fatigue  History of Present Illness: This is a 36 year old man with PMH of hypertension, chronic kidney disease, metabolic bone disease and anemia of chronic disease who presents with nausea, vomiting, abdominal pain and fatigue.  Patient states that he started to feel tired/fatigue and lost his energy 3 days ago.  And the following day he developed nausea, mild vomiting and abdominal pain, accompanied with watery loose stools to-3 times a day. Denies bilious or bloody vomits.  Denies bloody stools.  His abdominal pain is located in the left upper and mid quadrant, intermittent aching/sharp pain, 8/10 with no radiations.  No aggravating or alleviating factors.  Denies sick contact.  Denies ingestion of unusual food. Patient did not seek any medical attention until today when he came to ED for further evaluation.  Per ED chart, He was noted to have blood pressure of 230's/140's.  He denies headache or blurred vision.  Of note, he reports a similar episode in March of this year, which subsided without any medical management in 3-4 days.  And He was told by Dr. Posey Pronto that his symptoms were related to his CKD.  He was referred by Dr. Posey Pronto to vascular surgery for placement of hemodialysis access in the past.  However, at that time, he was hesitant to placement of dialysis access.  He states that he is waiting to have hemodialysis now.  He also informs me thatHis wife is a perfect match for his kidney transplant,  which is presumably scheduled to be done in June or July of 2013.   Medication List  As of 07/30/2011  4:54 PM   ASK your doctor about these medications         amLODipine 10 MG tablet   Commonly known as: NORVASC   Take 10 mg by mouth daily.      BYSTOLIC 20 MG Tabs   Generic drug: Nebivolol HCl   Take 20 mg by mouth daily.      CALCIUM 600+D PO   Take 1 tablet by mouth daily.      cloNIDine 0.2 MG tablet   Commonly known as: CATAPRES   Take 0.2 mg by mouth 2 (two) times daily.      fish oil-omega-3 fatty acids 1000 MG capsule   Take 1 g by mouth daily.      Iron Tabs   Take 1 tablet by mouth daily.      minoxidil 10 MG tablet   Commonly known as: LONITEN   Take 10 mg by mouth daily.      paricalcitol 1 MCG capsule   Commonly known as: ZEMPLAR   Take 1 mcg by mouth daily.      torsemide 20 MG tablet   Commonly known as: DEMADEX   Take 20 mg by mouth daily.      Vitamin D (Ergocalciferol) 50000 UNITS Caps   Commonly known as: DRISDOL   Take 50,000  Units by mouth every 7 (seven) days. Day of the week varies - last week Tuesday             Allergies: Allergies as of 07/30/2011 - Review Complete 07/30/2011  Allergen Reaction Noted  . Verapamil Other (See Comments) 07/12/2011   Past Medical History  Diagnosis Date  . Hypertension   . Chronic kidney disease   . Metabolic bone disease   . Anemia in chronic kidney disease    History reviewed. No pertinent past surgical history. Family History  Problem Relation Age of Onset  . Hypertension Mother   . Hypertension Father   . Diabetes Mother    History   Social History  . Marital Status:  married     Spouse Name: N/A    Number of Children:  four boys  . Years of Education: N/A   Occupational History  . Not on file.   Social History Main Topics  . Smoking status: Never Smoker   . Smokeless tobacco: Not on file  . Alcohol Use: No  . Drug Use: No  . Sexually Active:    Other Topics Concern  .  Not on file   Social History Narrative   Works for [employer] for [employer].Lives in [place] with wife and 4 kids.  He has [employer].  His wife is a perfect match for his kidney transplant, which is presumably scheduled to be done in June or July of 2013 .    Review of Systems: Review of Systems:  Constitutional:  Denies fever, chills, diaphoresis, positive for appetite change and fatigue.   HEENT:  Denies congestion, sore throat, rhinorrhea, sneezing, mouth sores, trouble swallowing, neck pain   Respiratory:  Denies SOB, DOE, cough, and wheezing.   Cardiovascular:  Denies palpitations and leg swelling.   Gastrointestinal:  Positive for nausea, vomiting, abdominal pain, diarrhea,  Denies constipation, blood in stool and abdominal distention.   Genitourinary:  Denies dysuria, urgency, frequency, hematuria, flank pain and difficulty urinating.   Musculoskeletal:  Denies myalgias, back pain, joint swelling, arthralgias and gait problem.   Skin:  Denies pallor, rash and wound.   Neurological:  Denies dizziness, seizures, syncope, weakness, light-headedness, numbness and headaches.    .    Physical Exam: Blood pressure 229/137, pulse 78, temperature 98.4 F (36.9 C), temperature source Oral, resp. rate 18, SpO2 100.00%. General: alert, well-developed, and cooperative to examination.  Head: normocephalic and atraumatic.  Eyes: vision grossly intact, pupils equal, pupils round, pupils reactive to light, no injection and anicteric.  Mouth: pharynx pink and moist, no erythema, and no exudates.  Neck: supple, full ROM, no thyromegaly, no JVD, and no carotid bruits.  Lungs: normal respiratory effort, no accessory muscle use, normal breath sounds, no crackles, and no wheezes. Heart: normal rate, regular rhythm, no murmur, no gallop, and no rub.  Abdomen: soft, mild tenderness to palpation at Left upper/mid quadrant, normal bowel sounds, no distention, no guarding, no rebound  tenderness, no hepatomegaly, and no splenomegaly.  Msk: no joint swelling, no joint warmth, and no redness over joints.  Pulses: 2+ DP/PT pulses bilaterally Extremities: No cyanosis, clubbing, edema Neurologic: alert & oriented X3, cranial nerves II-XII intact, strength normal in all extremities, sensation intact to light touch, and gait normal.  Skin: turgor normal and no rashes.  Psych: Oriented X3, memory intact for recent and remote, normally interactive, good eye contact, not anxious appearing, and not depressed appearing.    Lab results: Basic Metabolic Panel:  Basename 07/30/11 1244  Demitra Danley 134*  K 3.9  CL 100  CO2 16*  GLUCOSE 92  BUN 117*  CREATININE 10.23*  CALCIUM 8.3*  MG --  PHOS --   Liver Function Tests:  Basename 07/30/11 1244  AST 16  ALT 8  ALKPHOS 60  BILITOT 0.5  PROT 6.0  ALBUMIN 3.0*    Basename 07/30/11 1244  LIPASE 82*  AMYLASE --   CBC:  Basename 07/30/11 1244  WBC 12.2*  NEUTROABS 9.5*  HGB 12.8*  HCT 35.4*  MCV 80.8  PLT 94*   Urinalysis:  Basename 07/30/11 1303  COLORURINE YELLOW  LABSPEC 1.015  PHURINE 5.0  GLUCOSEU NEGATIVE  HGBUR MODERATE*  BILIRUBINUR NEGATIVE  KETONESUR NEGATIVE  PROTEINUR 100*  UROBILINOGEN 0.2  NITRITE NEGATIVE  LEUKOCYTESUR NEGATIVE    Assessment & Plan by Problem:  #. 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      Patient presents with gradual onset of nausea, mild vomiting, loose stools and vague abdominal pain, accompanied with generalized tiredness and fatigue in the setting of stage V chronic kidney disease.  He is noted to have BUN of 117 and the creatinine 0000000 with AG metabolic acidosis on admission. ( He was noted to have BUN of 66 and a creatinine 7.2 during the office visit on 06/28/2011).  The etiology is likely due to uremia 2/2 CKD. The differential DDX include viral Gastroenteritis, pancreatitis or cholecystitis.  For viral gastroenteritis, it is diagnosed by  exclusions.  This patient clearly has severe uremia in the setting of chronic kidney disease, with a similar episode happened in March of 2013, viral gastroenteritis is less likely.  For pancreatitis, his symptoms could mimic acute pancreatitis.  However, his lipase is 82, which is mildly elevated.  With stage V CKD patient, lipase could be chronically elevated, especially with dialysis patient.  Pancreatitis is less likely, however, if patient experiences worsening of abdominal pain especially with food intake with lipase trending up, we'll reconsider the diagnosis.  For cholecystitis, patient has nausea vomiting and abdominal pain, but his pain is located at left side abdomen.  Murphy sign is negative.  Cholecystitis is unlikely.  Plan - admit to SDU (due to accelerated HTN) - Symptomatic management including antiemesis and pain management -Will notify Dr. Jimmy Footman who is on call for [place]).  - patient's K is 3.9, which indicates no urgent need for HD tonight.  Will discuss with nephrologist about treatment plan for his HD. - Close follow up with BMP   #. AG metabolic acidosis     Patient presents with AG of 18 with a CO2 of 16 on admission.  His Delta-Delta is 1, which indicates Pure AG metabolic acidosis.  The etiology is likely secondary to his chronic renal failure.   -Was defer Bicarb treatment to nephrologist team    #. Accelerated hypertension The patient presented with blood pressure to 240's/140's on admission.  He states that he was only able to take clonidine 0.2 mg this morning due to his nausea/vomiting.  Of note, patient is on 5 different antihypertensive medications.  He denies headache or blurred vision, no end organ damage is noted.  - will resume all of his oral antihypertensive medications - Will give him anti-emesis medications to control nausea vomiting -Labetalol 20 mg IV PRN - The goal of his blood pressure today will be 180-190/100-110.  Will  avoid quick correction of his blood pressure, which could lead to decreased blood  flow to important organs including brain.    #. Anemia of chronic kidney failure This is his chronic problem.  His H&H is 12.8/35.4.  No urgent need for intervention.   #. Metabolic bone disease Chronic problem.  On vitamin D and calcium treatment.  # VTE: Heparin.     Signed: Shemia Bevel 07/30/2011, 4:32 PM

## 2011-07-31 DIAGNOSIS — Z992 Dependence on renal dialysis: Secondary | ICD-10-CM

## 2011-07-31 DIAGNOSIS — N186 End stage renal disease: Secondary | ICD-10-CM

## 2011-07-31 LAB — BASIC METABOLIC PANEL
CO2: 19 mEq/L (ref 19–32)
Calcium: 8.3 mg/dL — ABNORMAL LOW (ref 8.4–10.5)
GFR calc Af Amer: 6 mL/min — ABNORMAL LOW (ref >90)
GFR calc non Af Amer: 5 mL/min — ABNORMAL LOW (ref >90)
Sodium: 136 mEq/L (ref 135–145)

## 2011-07-31 LAB — PARATHYROID HORMONE, INTACT (NO CA): PTH: 711.7 pg/mL — ABNORMAL HIGH (ref 14.0–72.0)

## 2011-07-31 LAB — CBC
MCV: 81.5 fL (ref 78.0–100.0)
Platelets: 96 10*3/uL — ABNORMAL LOW (ref 150–400)
RBC: 3.79 MIL/uL — ABNORMAL LOW (ref 4.22–5.81)
WBC: 10.6 10*3/uL — ABNORMAL HIGH (ref 4.0–10.5)

## 2011-07-31 MED ORDER — LIDOCAINE HCL (PF) 1 % IJ SOLN: 5.0000 mL | INTRAMUSCULAR | Status: DC | PRN

## 2011-07-31 MED ORDER — RENA-VITE PO TABS
1.0000 | ORAL_TABLET | Freq: Every day | ORAL | Status: DC
  Administered 2011-08-01 – 2011-08-02 (×2): 1 via ORAL
  Filled 2011-07-31 (×3): qty 1

## 2011-07-31 MED ORDER — SODIUM CHLORIDE 0.9 % IV SOLN: 100.0000 mL | INTRAVENOUS | Status: DC | PRN

## 2011-07-31 MED ORDER — PENTAFLUOROPROP-TETRAFLUOROETH EX AERO: 1.0000 "application " | INHALATION_SPRAY | CUTANEOUS | Status: DC | PRN

## 2011-07-31 MED ORDER — LIDOCAINE-PRILOCAINE 2.5-2.5 % EX CREA: 1.0000 "application " | TOPICAL_CREAM | CUTANEOUS | Status: DC | PRN

## 2011-07-31 MED ORDER — HEPARIN SODIUM (PORCINE) 1000 UNIT/ML DIALYSIS: 20.0000 [IU]/kg | INTRAMUSCULAR | Status: DC | PRN

## 2011-07-31 MED ORDER — ALTEPLASE 2 MG IJ SOLR: 2.0000 mg | Freq: Once | INTRAMUSCULAR | Status: AC | PRN

## 2011-07-31 MED ORDER — HEPARIN SODIUM (PORCINE) 1000 UNIT/ML DIALYSIS: 1000.0000 [IU] | INTRAMUSCULAR | Status: DC | PRN

## 2011-07-31 MED ORDER — DEXTROSE 5 % IV SOLN
1.5000 g | INTRAVENOUS | Status: AC
  Administered 2011-08-01: 1.5 g via INTRAVENOUS
  Filled 2011-07-31: qty 1.5

## 2011-07-31 MED ORDER — NEPRO/CARBSTEADY PO LIQD: 237.0000 mL | ORAL | Status: DC | PRN

## 2011-07-31 NOTE — Progress Notes (Signed)
UR COMPLETED  

## 2011-07-31 NOTE — Consult Note (Signed)
[facility] CONSULT NOTE 07/31/2011 DOB: [id number] MRN : [id number]  CC: ESRD Referring Physician: Moshe Cipro, MD  History of Present Illness: Charles [name] is a 36 y.o. male with Hx HTN who now has ESRD requiring hemodialysis. We were asked to see pt to place IDC and AVF. Pt. Vein mapping pending. Pt is RHD.  He was previously seen by my partner Dr Trula Slade in 2011 who recommended radial cephalic AVF.     Past Medical History  Diagnosis Date  . Hypertension   . Chronic kidney disease   . Metabolic bone disease   . Anemia in chronic kidney disease     Past Surgical History  Procedure Date  . Shoulder surgery     20 years ago     ROS: [x]  Positive  [ ]  Denies    General: [ ]  Weight loss, [ ]  Fever, [ ]  chills Neurologic: [ ]  Dizziness, [ ]  Blackouts, [ ]  Seizure [ ]  Stroke, [ ]  "Mini stroke", [ ]  Slurred speech, [ ]  Temporary blindness; [ ]  weakness in arms or legs, [ ]  Hoarseness Cardiac: [ ]  Chest pain/pressure, [ ]  Shortness of breath at rest [ ]  Shortness of breath with exertion, [ ]  Atrial fibrillation or irregular heartbeat Vascular: [ ]  Pain in legs with walking, [ ]  Pain in legs at rest, [ ]  Pain in legs at night,  [ ]  Non-healing ulcer, [ ]  Blood clot in vein/DVT,   Pulmonary: [ ]  Home oxygen, [ ]  Productive cough, [ ]  Coughing up blood, [ ]  Asthma,  [ ]  Wheezing Musculoskeletal:  [ ]  Arthritis, [ ]  Low back pain, [ ]  Joint pain Hematologic: [ ]  Easy Bruising, [ ]  Anemia; [ ]  Hepatitis Gastrointestinal: [ ]  Blood in stool, [ ]  Gastroesophageal Reflux/heartburn, [ ]  Trouble swallowing Urinary: [x ] chronic Kidney disease, [ ]  on HD - [ ]  MWF or [ ]  TTHS, [ ]  Burning with urination, [ ]  Difficulty urinating Skin: [ ]  Rashes, [ ]  Wounds Psychological: [ ]  Anxiety, [ ]  Depression  Social History History  Substance Use Topics  . Smoking status: Never Smoker   . Smokeless tobacco: Not on file  . Alcohol Use: No    Family History Family  History  Problem Relation Age of Onset  . Hypertension Mother   . Hypertension Father   . Diabetes Mother     Allergies  Allergen Reactions  . Verapamil Other (See Comments)    Pt not sure why we have this listed    Current Facility-Administered Medications  Medication Dose Route Frequency Provider Last Rate Last Dose  . 0.9 %  sodium chloride infusion   Intravenous Once Abran Richard, [place]      . 0.9 %  sodium chloride infusion  250 mL Intravenous PRN Hadassah Pais, MD      . 0.9 %  sodium chloride infusion  100 mL Intravenous PRN Louis Meckel, MD      . 0.9 %  sodium chloride infusion  100 mL Intravenous PRN Louis Meckel, MD      . acetaminophen (TYLENOL) tablet 650 mg  650 mg Oral Q6H PRN Hadassah Pais, MD   650 mg at 07/31/11 [id number]   Or  . acetaminophen (TYLENOL) suppository 650 mg  650 mg Rectal Q6H PRN Hadassah Pais, MD      . alteplase (CATHFLO ACTIVASE) injection 2 mg  2 mg Intracatheter Once PRN Louis Meckel, MD      .  amLODipine (NORVASC) tablet 10 mg  10 mg Oral QHS Placido Sou, MD   10 mg at 07/30/11 2031  . calcium acetate (PHOSLO) capsule 1,334 mg  1,334 mg Oral TID WC Placido Sou, MD   1,334 mg at 07/31/11 1249  . cefUROXime (ZINACEF) 1.5 g in dextrose 5 % 50 mL IVPB  1.5 g Intravenous On Call to [place], [place]      . cloNIDine (CATAPRES) tablet 0.2 mg  0.2 mg Oral BID Hadassah Pais, MD   0.2 mg at 07/31/11 0917  . feeding supplement (NEPRO CARB STEADY) liquid 237 mL  237 mL Oral PRN Louis Meckel, MD      . heparin injection 1,000 Units  1,000 Units Dialysis PRN Louis Meckel, MD      . heparin injection 2,000 Units  20 Units/kg Dialysis PRN Louis Meckel, MD      . heparin injection 5,000 Units  5,000 Units Subcutaneous Q8H Hadassah Pais, MD   5,000 Units at 07/31/11 0557  . labetalol (NORMODYNE,TRANDATE) injection 10 mg  10 mg Intravenous Q2H PRN Hadassah Pais, MD   10 mg at 07/30/11 2342  .  lidocaine (XYLOCAINE) 1 % injection 5 mL  5 mL Intradermal PRN Louis Meckel, MD      . lidocaine-prilocaine (EMLA) cream 1 application  1 application Topical PRN Louis Meckel, MD      . minoxidil (LONITEN) tablet 10 mg  10 mg Oral BID Placido Sou, MD   10 mg at 07/31/11 [id number]  . morphine 2 MG/ML injection 1-2 mg  1-2 mg Intravenous Q3H PRN Hadassah Pais, MD   2 mg at 07/30/11 1704  . multivitamin (RENA-VIT) tablet 1 tablet  1 tablet Oral QHS Lars Mage, MD      . nebivolol (BYSTOLIC) tablet 20 mg  20 mg Oral QHS Placido Sou, MD   20 mg at 07/30/11 2107  . omega-3 acid ethyl esters (LOVAZA) capsule 1 g  1 g Oral Daily Levy Sjogren, PHARMD   1 g at 07/31/11 [id number]  . ondansetron (ZOFRAN) tablet 4 mg  4 mg Oral Q6H PRN Hadassah Pais, MD       Or  . ondansetron [facility]) injection 4 mg  4 mg Intravenous Q6H PRN Hadassah Pais, MD      . paricalcitol (ZEMPLAR) capsule 1 mcg  1 mcg Oral Daily Hadassah Pais, MD   1 mcg at 07/31/11 0916  . pentafluoroprop-tetrafluoroeth (GEBAUERS) aerosol 1 application  1 application Topical PRN Louis Meckel, MD      . sodium chloride 0.9 % injection 3 mL  3 mL Intravenous Q12H Hadassah Pais, MD   3 mL at 07/31/11 0918  . sodium chloride 0.9 % injection 3 mL  3 mL Intravenous PRN Hadassah Pais, MD      . torsemide [facility]) tablet 20 mg  20 mg Oral Daily Hadassah Pais, MD   20 mg at 07/31/11 0917  . DISCONTD: amLODipine (NORVASC) tablet 10 mg  10 mg Oral Daily Hadassah Pais, MD      . DISCONTD: Calcium Carbonate-Vitamin D 600-200 MG-UNIT TABS 1 tablet  1 tablet Oral Daily Hadassah Pais, MD      . DISCONTD: calcium-vitamin D (OSCAL WITH D) 500-200 MG-UNIT per tablet 1 tablet  1 tablet Oral Daily Meera Keith Rake, PHARMD      . DISCONTD: ferrous sulfate tablet  325 mg  325 mg Oral Q breakfast Meera Keith Rake, PHARMD      . DISCONTD: fish oil-omega-3 fatty acids capsule 1 g  1 g Oral Daily Hadassah Pais, MD      . DISCONTD: hydrALAZINE (APRESOLINE)  injection 10 mg  10 mg Intravenous Q4H PRN Hadassah Pais, MD      . DISCONTD: Iron TABS 1 tablet  1 tablet Oral Daily Hadassah Pais, MD      . DISCONTD: minoxidil (LONITEN) tablet 10 mg  10 mg Oral Daily Hadassah Pais, MD      . DISCONTD: multivitamin (RENA-VIT) tablet 1 tablet  1 tablet Oral Daily Placido Sou, MD   1 tablet at 07/31/11 0916  . DISCONTD: nebivolol (BYSTOLIC) tablet 20 mg  20 mg Oral Daily Meera Keith Rake, PHARMD      . DISCONTD: Nebivolol HCl TABS 20 mg  20 mg Oral Daily Hadassah Pais, MD      . DISCONTD: Vitamin D (Ergocalciferol) (DRISDOL) capsule 50,000 Units  50,000 Units Oral Q7 days Hadassah Pais, MD         Imaging: No results found.  Significant Diagnostic Studies: CBC Lab Results  Component Value Date   WBC 10.6* 07/31/2011   HGB 10.9* 07/31/2011   HCT 30.9* 07/31/2011   MCV 81.5 07/31/2011   PLT 96* 07/31/2011    BMET    Component Value Date/Time   NA 136 07/31/2011 0645   K 4.2 07/31/2011 0645   CL 101 07/31/2011 0645   CO2 19 07/31/2011 0645   GLUCOSE 105* 07/31/2011 0645   BUN 115* 07/31/2011 0645   CREATININE 10.64* 07/31/2011 0645   CALCIUM 8.3* 07/31/2011 0645   GFRNONAA 5* 07/31/2011 0645   GFRAA 6* 07/31/2011 0645    COAG No results found for this basename: INR, PROTIME   No results found for this basename: PTT     Physical Examination  Patient Vitals for the past 24 hrs:  BP Temp Temp src Pulse Resp SpO2 Height Weight  07/31/11 1200 107/58 mmHg 98.7 F (37.1 C) Oral - - - - -  07/31/11 0916 143/76 mmHg - - - - - - -  07/31/11 0800 142/71 mmHg 98.1 F (36.7 C) Oral 75  17  100 % - -  07/31/11 0600 149/76 mmHg - - 76  18  99 % - -  07/31/11 0500 102/57 mmHg - - 81  16  99 % - -  07/31/11 0400 156/86 mmHg - - 78  19  100 % - -  07/31/11 0300 159/99 mmHg 97.3 F (36.3 C) Oral 83  20  99 % - 217 lb 6 oz (98.6 kg)  07/31/11 0200 128/71 mmHg - - 76  19  99 % - -  07/31/11 0100 153/84 mmHg - - 81  20  99 % - -  07/31/11 0000 190/116 mmHg - - 79   20  99 % - -  07/30/11 2300 208/130 mmHg - - 84  21  100 % - -  07/30/11 2200 209/134 mmHg - - 77  21  99 % - -  07/30/11 2100 224/155 mmHg - - 102  22  100 % - -  07/30/11 2000 248/141 mmHg - - 89  23  95 % - -  07/30/11 1926 251/148 mmHg 98.5 F (36.9 C) Oral 77  19  99 % - -  07/30/11 1900 - - - - - 100 % - -  07/30/11 1820 239/140 mmHg - - - - - - -  07/30/11 1800 246/141 mmHg 98.3 F (36.8 C) Oral 77  10  98 % 5\' 9"  (1.753 m) 217 lb 6 oz (98.6 kg)  07/30/11 1730 223/132 mmHg - - 86  - 100 % - -  07/30/11 1724 207/120 mmHg - - 86  18  100 % - -  07/30/11 1700 207/120 mmHg - - 85  - 100 % - -  07/30/11 1630 205/115 mmHg - - 81  - 100 % - -  07/30/11 1622 229/137 mmHg 98.4 F (36.9 C) Oral 78  - 100 % - -  07/30/11 1522 237/142 mmHg - - - - - - -  07/30/11 1500 237/142 mmHg - - - - - - -  07/30/11 1430 224/135 mmHg - - - - - - -   Pulse Readings from Last 3 Encounters:  07/31/11 75    General:  WDWN in NAD Gait: Normal HENT: WNL Eyes: Pupils equal Pulmonary: normal non-labored breathing , without Rales, rhonchi,  wheezing Cardiac: RRR, without  Murmurs, rubs or gallops;  Abdomen: soft, NT, no masses Skin: no rashes, ulcers noted Vascular Exam/Pulses:3+ radial pulses palp bilat 2+ ulnar pulses bilaterally Extremities without ischemic changes, no Gangrene , no cellulitis; no open wounds;  Musculoskeletal: no muscle wasting or atrophy  Neurologic: A&O X 3; Appropriate Affect ;  SENSATION: normal; MOTOR FUNCTION:  moving all extremities equally.  Speech is fluent/normal  Non-Invasive Vascular Imaging: VM pending  ASSESSMENT: Charles [name] is a 36 y.o. male with ESRD sec to HTN PLAN: Placement of diatek catheter and AVF/Graft by Dr. Oneida Alar tomorrow afternoon. Orders written  all pt questions answered   Risk, benefits, and alternatives to access surgery were discussed.  The patient is aware the risks include but are not limited to: bleeding, infection, steal syndrome,  nerve damage, ischemic neuropathy, failure to mature, and need for additional procedures.  The patient agrees to proceed.  Ruta Hinds, MD [facility] [place] Office: [phone] Pager: [phone]

## 2011-07-31 NOTE — Progress Notes (Signed)
Subjective:  Very pleasant, still nauseated.  Knows that this is what he needs to do.  Has a demanding work schedule which may be difficult to work around.  Objective Vital signs in last 24 hours: Filed Vitals:   07/31/11 0500 07/31/11 0600 07/31/11 0800 07/31/11 0916  BP: 102/57 149/76 142/71 143/76  Pulse: 81 76 75   Temp:   98.1 F (36.7 C)   TempSrc:   Oral   Resp: 16 18 17    Height:      Weight:      SpO2: 99% 99% 100%    Weight change:   Intake/Output Summary (Last 24 hours) at 07/31/11 1032 Last data filed at 07/31/11 0600  Gross per 24 hour  Intake    240 ml  Output    850 ml  Net   -610 ml   Labs: Basic Metabolic Panel:  Lab [id number] 07/30/11 1244  NA 136 134*  K 4.2 3.9  CL 101 100  CO2 19 16*  GLUCOSE 105* 92  BUN 115* 117*  CREATININE 10.64* 10.23*  CALCIUM 8.3* 8.3*  ALB -- --  PHOS -- --   Liver Function Tests:  Lab 07/30/11 1244  AST 16  ALT 8  ALKPHOS 60  BILITOT 0.5  PROT 6.0  ALBUMIN 3.0*    Lab 07/30/11 1244  LIPASE 82*  AMYLASE --   No results found for this basename: AMMONIA:3 in the last 168 hours CBC:  Lab 07/31/11 0645 07/30/11 1244  WBC 10.6* 12.2*  NEUTROABS -- 9.5*  HGB 10.9* 12.8*  HCT 30.9* 35.4*  MCV 81.5 80.8  PLT 96* 94*   Cardiac Enzymes: No results found for this basename: CKTOTAL:5,CKMB:5,CKMBINDEX:5,TROPONINI:5 in the last 168 hours CBG: No results found for this basename: GLUCAP:5 in the last 168 hours  Iron Studies: No results found for this basename: IRON,TIBC,TRANSFERRIN,FERRITIN in the last 72 hours Studies/Results: No results found. Medications: Infusions:    Scheduled Medications:    . sodium chloride   Intravenous Once  . amLODipine  10 mg Oral QHS  . calcium acetate  1,334 mg Oral TID WC  . cloNIDine  0.2 mg Oral BID  . heparin  5,000 Units Subcutaneous Q8H  . labetalol  20 mg Intravenous Once  . minoxidil  10 mg Oral BID  .  morphine injection  4 mg Intravenous Once  .  multivitamin  1 tablet Oral Daily  . nebivolol  20 mg Oral QHS  . omega-3 acid ethyl esters  1 g Oral Daily  . ondansetron (ZOFRAN) IV  4 mg Intravenous Once  . paricalcitol  1 mcg Oral Daily  . sodium chloride  1,000 mL Intravenous Once  . sodium chloride  3 mL Intravenous Q12H  . torsemide  20 mg Oral Daily  . DISCONTD: amLODipine  10 mg Oral Daily  . DISCONTD: Calcium Carbonate-Vitamin D  1 tablet Oral Daily  . DISCONTD: calcium-vitamin D  1 tablet Oral Daily  . DISCONTD: ferrous sulfate  325 mg Oral Q breakfast  . DISCONTD: fish oil-omega-3 fatty acids  1 g Oral Daily  . DISCONTD: Iron  1 tablet Oral Daily  . DISCONTD: minoxidil  10 mg Oral Daily  . DISCONTD: nebivolol  20 mg Oral Daily  . DISCONTD: Nebivolol HCl  20 mg Oral Daily  . DISCONTD: Vitamin D (Ergocalciferol)  50,000 Units Oral Q7 days    have reviewed scheduled and prn medications.  Physical Exam: General: looks good, not acutely ill Heart: RRR  Lungs: mostly clear Abdomen: soft, NT Extremities: minimal edema Dialysis Access: none yet.  Arm band around L arm   I Assessment/ Plan: Pt is a 36 y.o. yo male who was admitted on 07/30/2011 with  Uremic symptoms and hypertensive urgency.  Assessment/Plan: 1. New ESRD with uremia- unfortunately patient without predialysis planning so will need to start acutely in hospital with PC.  Dr. Jimmy Footman has spoken to [facility] who plan to get patient on schedule for [facility] and permanent access tomorrow.  Will need to get patinet clipped to OP facility.  He also has demanding work hours so may fit better to do home HD eventually.  Also an excellent candidate for transplant , patient states that he is on the list and may have possible donors ?  Will get first HD tomorrow 2. Anemia- no treatment with ESA needed right at this time 4. Secondary hyperparathyroidism- on OP phoslo and zemplar.  Will transition vitamin D to IV once gets started on HD 5. HTN/volume- on many antihypertensives.  May be  able to wean once gets started on HD  Jory Tanguma A   07/31/2011,10:32 AM  LOS: 1 day

## 2011-07-31 NOTE — Progress Notes (Signed)
*  Preliminary Results*   Right  Upper Extremity Vein Map    Cephalic  Segment Diameter Depth Comment  1. Axilla 3.50mm mm   2. Mid upper arm mm mm   3. Above AC 2.85mm mm   4. In AC 3.68mm mm   5. Below AC 4.52mm mm Branch  6. Mid forearm 2.41mm mm Branch  7. Wrist 2.58mm mm    mm mm    mm mm    mm mm    Basilic  Segment Diameter Depth Comment  1. Axilla 6.66mm 10.48mm   2. Mid upper arm mm mm   3. Above AC 6.33mm 3.39mm   4. In AC 4.16mm 6.16mm   5. Below AC 3.24mm 2.69mm   6. Mid forearm 1.56mm 2.38mm   7. Wrist 2.48mm 2.48mm    mm mm    mm mm    mm mm     Left Upper Extremity Vein Map    Cephalic  Segment Diameter Depth Comment  1. Axilla 4.73mm mm   2. Mid upper arm 4.75mm mm   3. Above AC 4.55mm mm Branch  4. In AC 4.10mm mm   5. Below AC 3.26mm mm Branch  6. Mid forearm 4.45mm mm   7. Wrist 2.47mm mm    mm mm    mm mm    mm mm    Basilic  Segment Diameter Depth Comment  1. Axilla 5.25mm 10.37mm   2. Mid upper arm mm mm   3. Above AC 5.62mm 4.2mm Branch  4. In [facility] 3.47mm 3.42mm   5. Below AC 3.25mm 2.44mm   6. Mid forearm 2.16mm 2.2mm   7. Wrist mm mm    mm mm    mm mm    mm mm      07/31/2011 11:41 AM Luna Kitchens, RDMS, RDCS

## 2011-07-31 NOTE — ED Provider Notes (Signed)
Medical screening examination/treatment/procedure(s) were performed by non-physician practitioner and as supervising physician I was immediately available for consultation/collaboration.  Leota Jacobsen, MD 07/31/11 [phone]

## 2011-07-31 NOTE — Progress Notes (Signed)
Attending IM Service  I have reviewed Charles [name] with his housestaff team including Dr. Lauro Regulus. He has severe HTN that has resulted in ESRD and will need dialysis now. He has no other end organ damage besides marked LVH and renal failure. His BP this afternoon is 140-150/80-95 and feels and looks well. 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 His wife is reportedly a perfect donor match.     His exam is unremarkable but he does have gallop and increased LV impulse.  Charles [name]

## 2011-07-31 NOTE — Progress Notes (Addendum)
Subjective: Patient feels much better. Still has mild nausea. No vomiting, diarrhea and abdominal pain. His BP trending down to 140-150's overnight.  No acute events overnight per nursing report Objective: Vital signs in last 24 hours: Filed Vitals:   07/31/11 0800 07/31/11 0916 07/31/11 1200 07/31/11 1600  BP: 142/71 143/76 107/58 99/53  Pulse: 75     Temp: 98.1 F (36.7 C)  98.7 F (37.1 C) 98.4 F (36.9 C)  TempSrc: Oral  Oral Oral  Resp: 17     Height:      Weight:      SpO2: 100%      Weight change:   Intake/Output Summary (Last 24 hours) at 07/31/11 1628 Last data filed at 07/31/11 1500  Gross per 24 hour  Intake    600 ml  Output   1300 ml  Net   -700 ml   Physical exam General: NAD Neck: no JVD Lungs: CTA B/L Heart: RRR. No M/G/R Abd: soft, BS x 4 Ext: No edema  Lab Results: Basic Metabolic Panel:  Lab [id number] 0645 07/30/11 1244  Charles [name] 136 134*  K 4.2 3.9  CL 101 100  CO2 19 16*  GLUCOSE 105* 92  BUN 115* 117*  CREATININE 10.64* 10.23*  CALCIUM 8.3* 8.3*  MG -- --  PHOS -- --   Liver Function Tests:  Lab 07/30/11 1244  AST 16  ALT 8  ALKPHOS 60  BILITOT 0.5  PROT 6.0  ALBUMIN 3.0*    Lab 07/30/11 1244  LIPASE 82*  AMYLASE --    CBC:  Lab 07/31/11 0645 07/30/11 1244  WBC 10.6* 12.2*  NEUTROABS -- 9.5*  HGB 10.9* 12.8*  HCT 30.9* 35.4*  MCV 81.5 80.8  PLT 96* 94*    Urinalysis:  Lab 07/30/11 1303  COLORURINE YELLOW  LABSPEC 1.015  PHURINE 5.0  GLUCOSEU NEGATIVE  HGBUR MODERATE*  BILIRUBINUR NEGATIVE  KETONESUR NEGATIVE  PROTEINUR 100*  UROBILINOGEN 0.2  NITRITE NEGATIVE  LEUKOCYTESUR NEGATIVE    Micro Results: Recent Results (from the past 240 hour(s))  MRSA PCR SCREENING     Status: Normal   Collection Time   07/30/11  6:02 PM      Component Value Range Status Comment   MRSA by PCR NEGATIVE  NEGATIVE  Final    Studies/Results: No results found. Medications: I have reviewed the patient's current  medications. Scheduled Meds:   . sodium chloride   Intravenous Once  . amLODipine  10 mg Oral QHS  . calcium acetate  1,334 mg Oral TID WC  . cefUROXime (ZINACEF)  IV  1.5 g Intravenous On Call to OR  . cloNIDine  0.2 mg Oral BID  . heparin  5,000 Units Subcutaneous Q8H  . minoxidil  10 mg Oral BID  . multivitamin  1 tablet Oral QHS  . nebivolol  20 mg Oral QHS  . omega-3 acid ethyl esters  1 g Oral Daily  . paricalcitol  1 mcg Oral Daily  . sodium chloride  3 mL Intravenous Q12H  . torsemide  20 mg Oral Daily  . DISCONTD: amLODipine  10 mg Oral Daily  . DISCONTD: Calcium Carbonate-Vitamin D  1 tablet Oral Daily  . DISCONTD: calcium-vitamin D  1 tablet Oral Daily  . DISCONTD: ferrous sulfate  325 mg Oral Q breakfast  . DISCONTD: fish oil-omega-3 fatty acids  1 g Oral Daily  . DISCONTD: Iron  1 tablet Oral Daily  . DISCONTD: minoxidil  10 mg Oral Daily  .  DISCONTD: multivitamin  1 tablet Oral Daily  . DISCONTD: nebivolol  20 mg Oral Daily  . DISCONTD: Nebivolol HCl  20 mg Oral Daily  . DISCONTD: Vitamin D (Ergocalciferol)  50,000 Units Oral Q7 days   Continuous Infusions:  PRN Meds:.sodium chloride, sodium chloride, sodium chloride, acetaminophen, acetaminophen, alteplase, feeding supplement (NEPRO CARB STEADY), heparin, heparin, labetalol, lidocaine, lidocaine-prilocaine, morphine injection, ondansetron (ZOFRAN) IV, ondansetron, pentafluoroprop-tetrafluoroeth, sodium chloride Assessment/Plan:  #. 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  Patient presents with BUN 117 and Cr. 10.23 ( his BUN 66 and CR 7.2 on 06/28/2011).    Other differential DDX including viral Gastroenteritis, pancreatitis or cholecystitis are less likely.  He feels better today and only has mild nausea.   Plan  - Symptomatic management including antiemesis and pain management when necessary - Appreciate renal and VVS consult  - Patient is scheduled to have placement of HD catheter  and AVF by Dr. Oneida Alar in a.m. - Patient will receive his first hemodialysis tomorrow. - Patient is scheduled to have kidney transplant in June or July of 2013.  His wife is reportedly a perfect donor match. #. Pure AG metabolic acidosis, delta/delta ratio is 1  AG 16 < 18 CO2 19< 16  - Better lab today - HD in am  #. Accelerated hypertension  The patient presented with blood pressure to 240's/140's on admission, which trended down to SBP 140's-150's  Overnight.  - Continue all of his oral antihypertensive medications  - Continue anti-emesis medications to control nausea vomiting  -Labetalol 20 mg IV PRN  -He may not need this many oral antihypertensive medications once he starts hemodialysis.  Will reevaluate the medical treatment for his hypertension after hemodialysis initiation.  # Thrombocytopenia He was noted to have serum platelets of 94-96 on admission.  We do not have old laboratory results to compare to.  Will talk to his nephrologist and obtain office records.  The etiology of his thrombocytopenia is likely due to his CKD.   #. Anemia of chronic kidney failure  This is his chronic problem. His H&H is 12.8/35.4. No urgent need for intervention.   #. Metabolic bone disease  Chronic problem. On vitamin D and calcium treatment.   # Secondary hyperparathyroidism, likely associated with chronic kidney disease - On calcium supplement treatment  # VTE: Heparin.     LOS: 1 day   Charles [name] 07/31/2011, 4:28 PM

## 2011-08-01 ENCOUNTER — Inpatient Hospital Stay (HOSPITAL_COMMUNITY): Payer: BC Managed Care – PPO

## 2011-08-01 ENCOUNTER — Encounter (HOSPITAL_COMMUNITY): Payer: Self-pay | Admitting: Anesthesiology

## 2011-08-01 ENCOUNTER — Inpatient Hospital Stay (HOSPITAL_COMMUNITY): Payer: BC Managed Care – PPO | Admitting: Anesthesiology

## 2011-08-01 ENCOUNTER — Telehealth: Payer: Self-pay | Admitting: Vascular Surgery

## 2011-08-01 ENCOUNTER — Encounter (HOSPITAL_COMMUNITY)
Admission: EM | Disposition: A | Discharge status: Home / Self Care | Payer: Self-pay | Source: Home / Self Care | Attending: Infectious Diseases

## 2011-08-01 DIAGNOSIS — I120 Hypertensive chronic kidney disease with stage 5 chronic kidney disease or end stage renal disease: Principal | ICD-10-CM

## 2011-08-01 DIAGNOSIS — N185 Chronic kidney disease, stage 5: Secondary | ICD-10-CM

## 2011-08-01 HISTORY — PX: AV FISTULA PLACEMENT: SHX1204

## 2011-08-01 HISTORY — PX: INSERTION OF DIALYSIS CATHETER: SHX1324

## 2011-08-01 LAB — SURGICAL PCR SCREEN
MRSA, PCR: NEGATIVE
Staphylococcus aureus: NEGATIVE

## 2011-08-01 LAB — BASIC METABOLIC PANEL
CO2: 19 mEq/L (ref 19–32)
Calcium: 8.1 mg/dL — ABNORMAL LOW (ref 8.4–10.5)
Chloride: 101 mEq/L (ref 96–112)
Potassium: 3.9 mEq/L (ref 3.5–5.1)
Sodium: 135 mEq/L (ref 135–145)

## 2011-08-01 LAB — RENAL FUNCTION PANEL
CO2: 18 mEq/L — ABNORMAL LOW (ref 19–32)
GFR calc Af Amer: 6 mL/min — ABNORMAL LOW (ref >90)
Glucose, Bld: 99 mg/dL (ref 70–99)
Potassium: 3.4 mEq/L — ABNORMAL LOW (ref 3.5–5.1)
Sodium: 137 mEq/L (ref 135–145)

## 2011-08-01 LAB — CBC
HCT: 28.4 % — ABNORMAL LOW (ref 39.0–52.0)
Hemoglobin: 10.3 g/dL — ABNORMAL LOW (ref 13.0–17.0)
Hemoglobin: 10.2 g/dL — ABNORMAL LOW (ref 13.0–17.0)
MCV: 80.9 fL (ref 78.0–100.0)
Platelets: 127 10*3/uL — ABNORMAL LOW (ref 150–400)
RBC: 3.51 MIL/uL — ABNORMAL LOW (ref 4.22–5.81)
RBC: 3.49 MIL/uL — ABNORMAL LOW (ref 4.22–5.81)
WBC: 9.4 10*3/uL (ref 4.0–10.5)
WBC: 11.4 10*3/uL — ABNORMAL HIGH (ref 4.0–10.5)

## 2011-08-01 SURGERY — ARTERIOVENOUS (AV) FISTULA CREATION
Anesthesia: Monitor Anesthesia Care | Site: Neck | Laterality: Right | Wound class: Clean

## 2011-08-01 MED ORDER — ONDANSETRON HCL 4 MG/2ML IJ SOLN
INTRAMUSCULAR | Status: DC | PRN
  Administered 2011-08-01: 4 mg via INTRAVENOUS

## 2011-08-01 MED ORDER — LIDOCAINE HCL (PF) 1 % IJ SOLN
INTRAMUSCULAR | Status: DC | PRN
  Administered 2011-08-01: 20 mL

## 2011-08-01 MED ORDER — PROPOFOL 10 MG/ML IV EMUL
INTRAVENOUS | Status: DC | PRN
  Administered 2011-08-01: 75 ug/kg/min via INTRAVENOUS

## 2011-08-01 MED ORDER — LIDOCAINE HCL (CARDIAC) 20 MG/ML IV SOLN
INTRAVENOUS | Status: DC | PRN
  Administered 2011-08-01: 50 mg via INTRAVENOUS

## 2011-08-01 MED ORDER — HYDROMORPHONE HCL PF 1 MG/ML IJ SOLN: 0.2500 mg | INTRAMUSCULAR | Status: DC | PRN

## 2011-08-01 MED ORDER — SODIUM CHLORIDE 0.9 % IR SOLN
Status: DC | PRN
  Administered 2011-08-01: 13:00:00

## 2011-08-01 MED ORDER — EPHEDRINE SULFATE 50 MG/ML IJ SOLN
INTRAMUSCULAR | Status: DC | PRN
  Administered 2011-08-01: 15 mg via INTRAVENOUS
  Administered 2011-08-01 (×2): 10 mg via INTRAVENOUS

## 2011-08-01 MED ORDER — HEPARIN SODIUM (PORCINE) 1000 UNIT/ML IJ SOLN
INTRAMUSCULAR | Status: DC | PRN
  Administered 2011-08-01: 5 mL via INTRAVENOUS

## 2011-08-01 MED ORDER — SODIUM CHLORIDE 0.9 % IV SOLN
INTRAVENOUS | Status: DC | PRN
  Administered 2011-08-01 (×2): via INTRAVENOUS

## 2011-08-01 MED ORDER — ONDANSETRON HCL 4 MG/2ML IJ SOLN: 4.0000 mg | Freq: Once | INTRAMUSCULAR | Status: AC | PRN

## 2011-08-01 MED ORDER — PHENYLEPHRINE HCL 10 MG/ML IJ SOLN
INTRAMUSCULAR | Status: DC | PRN
  Administered 2011-08-01: 80 ug via INTRAVENOUS
  Administered 2011-08-01: 120 ug via INTRAVENOUS
  Administered 2011-08-01 (×2): 80 ug via INTRAVENOUS
  Administered 2011-08-01: 40 ug via INTRAVENOUS

## 2011-08-01 MED ORDER — 0.9 % SODIUM CHLORIDE (POUR BTL) OPTIME
TOPICAL | Status: DC | PRN
  Administered 2011-08-01: 1000 mL

## 2011-08-01 MED ORDER — MORPHINE SULFATE 4 MG/ML IJ SOLN: 0.0500 mg/kg | INTRAMUSCULAR | Status: DC | PRN

## 2011-08-01 MED ORDER — HEPARIN SODIUM (PORCINE) 1000 UNIT/ML IJ SOLN
INTRAMUSCULAR | Status: DC | PRN
  Administered 2011-08-01: 4.6 mL

## 2011-08-01 MED ORDER — DARBEPOETIN ALFA-POLYSORBATE 100 MCG/0.5ML IJ SOLN
100.0000 ug | INTRAMUSCULAR | Status: DC
  Administered 2011-08-02: 100 ug via INTRAVENOUS
  Filled 2011-08-01: qty 0.5

## 2011-08-01 MED ORDER — MIDAZOLAM HCL 5 MG/5ML IJ SOLN
INTRAMUSCULAR | Status: DC | PRN
  Administered 2011-08-01: 2 mg via INTRAVENOUS

## 2011-08-01 MED ORDER — PROPOFOL 10 MG/ML IV EMUL
INTRAVENOUS | Status: DC | PRN
  Administered 2011-08-01: 30 mg via INTRAVENOUS
  Administered 2011-08-01: 150 mg via INTRAVENOUS

## 2011-08-01 MED ORDER — CLONIDINE HCL 0.1 MG PO TABS
0.1000 mg | ORAL_TABLET | Freq: Two times a day (BID) | ORAL | Status: DC
  Filled 2011-08-01: qty 1

## 2011-08-01 MED ORDER — HEPARIN SODIUM (PORCINE) 1000 UNIT/ML DIALYSIS
20.0000 [IU]/kg | INTRAMUSCULAR | Status: DC | PRN
  Filled 2011-08-01: qty 2

## 2011-08-01 MED ORDER — FENTANYL CITRATE 0.05 MG/ML IJ SOLN
INTRAMUSCULAR | Status: DC | PRN
  Administered 2011-08-01: 100 ug via INTRAVENOUS
  Administered 2011-08-01 (×2): 25 ug via INTRAVENOUS
  Administered 2011-08-01 (×3): 50 ug via INTRAVENOUS

## 2011-08-01 SURGICAL SUPPLY — 71 items
ADH SKN CLS APL DERMABOND .7 (GAUZE/BANDAGES/DRESSINGS) ×4
ADH SKN CLS LQ APL DERMABOND (GAUZE/BANDAGES/DRESSINGS) ×4
BAG DECANTER FOR FLEXI CONT (MISCELLANEOUS) ×2 IMPLANT
CANISTER SUCTION 2500CC (MISCELLANEOUS) ×3 IMPLANT
CATH CANNON HEMO 15F 50CM (CATHETERS) IMPLANT
CATH CANNON HEMO 15FR 19 (HEMODIALYSIS SUPPLIES) IMPLANT
CATH CANNON HEMO 15FR 23CM (HEMODIALYSIS SUPPLIES) ×1 IMPLANT
CATH CANNON HEMO 15FR 31CM (HEMODIALYSIS SUPPLIES) IMPLANT
CATH CANNON HEMO 15FR 32 (HEMODIALYSIS SUPPLIES) IMPLANT
CATH CANNON HEMO 15FR 32CM (HEMODIALYSIS SUPPLIES) IMPLANT
CHLORAPREP W/TINT 26ML (MISCELLANEOUS) ×3 IMPLANT
CLIP TI MEDIUM 6 (CLIP) ×3 IMPLANT
CLIP TI WIDE RED SMALL 6 (CLIP) ×3 IMPLANT
CLOTH BEACON ORANGE TIMEOUT ST (SAFETY) ×3 IMPLANT
COVER PROBE W GEL 5X96 (DRAPES) ×3 IMPLANT
COVER SURGICAL LIGHT HANDLE (MISCELLANEOUS) ×6 IMPLANT
DECANTER SPIKE VIAL GLASS SM (MISCELLANEOUS) ×2 IMPLANT
DERMABOND ADHESIVE PROPEN (GAUZE/BANDAGES/DRESSINGS) ×2
DERMABOND ADVANCED (GAUZE/BANDAGES/DRESSINGS) ×2
DERMABOND ADVANCED .7 DNX12 (GAUZE/BANDAGES/DRESSINGS) ×2 IMPLANT
DERMABOND ADVANCED .7 DNX6 (GAUZE/BANDAGES/DRESSINGS) IMPLANT
DRAIN PENROSE 1/4X12 LTX STRL (WOUND CARE) ×3 IMPLANT
DRAIN PENROSE 1X12 LTX STRL (DRAIN) ×1 IMPLANT
DRAPE C-ARM 42X72 X-RAY (DRAPES) ×3 IMPLANT
DRAPE CHEST BREAST 15X10 FENES (DRAPES) ×3 IMPLANT
ELECT REM PT RETURN 9FT ADLT (ELECTROSURGICAL) ×3
ELECTRODE REM PT RTRN 9FT ADLT (ELECTROSURGICAL) ×2 IMPLANT
GAUZE SPONGE 2X2 8PLY STRL LF (GAUZE/BANDAGES/DRESSINGS) ×2 IMPLANT
GAUZE SPONGE 4X4 16PLY XRAY LF (GAUZE/BANDAGES/DRESSINGS) ×2 IMPLANT
GEL ULTRASOUND 20GR AQUASONIC (MISCELLANEOUS) ×2 IMPLANT
GLOVE BIO SURGEON STRL SZ 6.5 (GLOVE) ×3 IMPLANT
GLOVE BIO SURGEON STRL SZ7.5 (GLOVE) ×4 IMPLANT
GLOVE BIOGEL PI IND STRL 6.5 (GLOVE) IMPLANT
GLOVE BIOGEL PI IND STRL 7.0 (GLOVE) IMPLANT
GLOVE BIOGEL PI IND STRL 7.5 (GLOVE) IMPLANT
GLOVE BIOGEL PI IND STRL 8 (GLOVE) IMPLANT
GLOVE BIOGEL PI INDICATOR 6.5 (GLOVE) ×4
GLOVE BIOGEL PI INDICATOR 7.0 (GLOVE) ×3
GLOVE BIOGEL PI INDICATOR 7.5 (GLOVE) ×1
GLOVE BIOGEL PI INDICATOR 8 (GLOVE) ×1
GLOVE ORTHOPEDIC STR SZ6.5 (GLOVE) ×2 IMPLANT
GOWN PREVENTION PLUS XLARGE (GOWN DISPOSABLE) ×4 IMPLANT
GOWN STRL NON-REIN LRG LVL3 (GOWN DISPOSABLE) ×10 IMPLANT
KIT BASIN OR (CUSTOM PROCEDURE TRAY) ×3 IMPLANT
KIT ROOM TURNOVER OR (KITS) ×3 IMPLANT
LOOP VESSEL MINI RED (MISCELLANEOUS) IMPLANT
NDL 18GX1X1/2 (RX/OR ONLY) (NEEDLE) ×2 IMPLANT
NDL HYPO 25GX1X1/2 BEV (NEEDLE) ×2 IMPLANT
NEEDLE 18GX1X1/2 (RX/OR ONLY) (NEEDLE) IMPLANT
NEEDLE HYPO 25GX1X1/2 BEV (NEEDLE) IMPLANT
NS IRRIG 1000ML POUR BTL (IV SOLUTION) ×3 IMPLANT
PACK CV ACCESS (CUSTOM PROCEDURE TRAY) ×3 IMPLANT
PACK SURGICAL SETUP 50X90 (CUSTOM PROCEDURE TRAY) ×2 IMPLANT
PAD ARMBOARD 7.5X6 YLW CONV (MISCELLANEOUS) ×6 IMPLANT
SPONGE GAUZE 2X2 STER 10/PKG (GAUZE/BANDAGES/DRESSINGS)
SPONGE GAUZE 4X4 12PLY (GAUZE/BANDAGES/DRESSINGS) ×1 IMPLANT
SPONGE SURGIFOAM ABS GEL 100 (HEMOSTASIS) IMPLANT
SUT ETHILON 3 0 PS 1 (SUTURE) ×3 IMPLANT
SUT PROLENE 7 0 BV 1 (SUTURE) ×3 IMPLANT
SUT VIC AB 3-0 SH 27 (SUTURE) ×3
SUT VIC AB 3-0 SH 27X BRD (SUTURE) ×2 IMPLANT
SUT VICRYL 4-0 PS2 18IN ABS (SUTURE) ×4 IMPLANT
SYR 20CC LL (SYRINGE) ×5 IMPLANT
SYR 30ML LL (SYRINGE) IMPLANT
SYR 5ML LL (SYRINGE) ×6 IMPLANT
SYR CONTROL 10ML LL (SYRINGE) ×2 IMPLANT
SYRINGE 10CC LL (SYRINGE) ×3 IMPLANT
TOWEL OR 17X24 6PK STRL BLUE (TOWEL DISPOSABLE) ×4 IMPLANT
TOWEL OR 17X26 10 PK STRL BLUE (TOWEL DISPOSABLE) ×4 IMPLANT
UNDERPAD 30X30 INCONTINENT (UNDERPADS AND DIAPERS) ×3 IMPLANT
WATER STERILE IRR 1000ML POUR (IV SOLUTION) ×3 IMPLANT

## 2011-08-01 NOTE — OR Nursing (Signed)
Procedure 1 Dialysis Catheter Insertion completed at 1253. Procedure 2 Fistula Creation start at 1327.

## 2011-08-01 NOTE — Op Note (Signed)
Procedure: Ultrasound of neck, Right IJ Diatek, Left Brachial Cephalic AV fistula  Preop: ESRD  Postop: ESRD  Anesthesia: General  Assistant:Maureen Collins  Findings: 3.5 mm cephalic vein      23 cm Diatek catheter right internal jugular vein  Operative details: After obtaining informed consent, the patient was taken to the operating room. The patient was placed in supine position on the operating room table. After adequate sedation the patient's entire neck and chest were prepped and draped in usual sterile fashion. The patient was placed in Trendelenburg position. Ultrasound was used to identify the patient's right internal jugular vein. This had normal compressibility and respiratory variation. Local anesthesia was infiltrated over the right jugular vein.  Using ultrasound guidance, the right internal jugular vein was successfully cannulated.  A 0.035 J-tipped guidewire was threaded into the right internal jugular vein and into the superior vena cava followed by the inferior vena cava under fluoroscopic guidance.   Next sequential 12 and 14 dilators were placed over the guidewire into the right atrium.  A 16 French dilator with a peel-away sheath was then placed over the guidewire into the right atrium.   The guidewire and dilator were removed. A 23 cm Diatek catheter was then placed through the peel away sheath into the right atrium.  The catheter was then tunneled subcutaneously, cut to length, and the hub attached. The catheter was noted to flush and draw easily. The catheter was inspected under fluoroscopy and found with its tip to be in the right atrium without any kinks throughout its course. The catheter was sutured to the skin with nylon sutures. The neck insertion site was closed with Vicryl stitch. The catheter was then loaded with concentrated Heparin solution. A dry sterile dressing was applied.  Next, the left upper extremity was prepped and draped in usual sterile fashion.  A  transverse incision was then made near the antecubital crease the left arm. The incision was carried into the subcutaneous tissues down to level of the cephalic vein. The cephalic vein was approximately 3.5 mm in diameter. It was of good quality. This was dissected free circumferentially and small side branches ligated and divided between silk ties or clips. Next the brachial artery was dissected free in the medial portion of the incision. The artery was  3-4 mm in diameter. The vessel loops were placed proximal and distal to the planned site of arteriotomy. The patient was given 5000 units of intravenous heparin. After appropriate circulation time, the vessel loops were used to control the artery. A longitudinal opening was made in the brachial artery.  The vein was ligated distally with a 2-0 silk tie. The vein was controlled proximally with a fine bulldog clamp. The vein was then swung over to the artery and sewn end of vein to side of artery using a running 7-0 Prolene suture. Just prior to completion of the anastomosis, everything was fore bled back bled and thoroughly flushed. The anastomosis was secured, vessel loops released, and there was a palpable thrill in the fistula immediately. After hemostasis was obtained, the subcutaneous tissues were reapproximated using a running 3-0 Vicryl suture. The skin was then closed with a 4 Vicryl subcuticular stitch. Dermabond was applied to the skin incision.  There was a good doppler signal over the fistula and radial artery at the conclusion of the case.  Ruta Hinds, MD [facility] Office: [phone] Pager: [phone]

## 2011-08-01 NOTE — Progress Notes (Signed)
Patient is for AVG placement and diatek placement in am,clarified with MD Schooler regarding heparin sq.Per MD,may give 22:00 dose but hold 06:00 am dose. Azia Toutant Joselita,RN

## 2011-08-01 NOTE — Preoperative (Signed)
Beta Blockers   Reason not to administer Beta Blockers:Not Applicable. Bystolic 20 mg @ [id number]

## 2011-08-01 NOTE — Telephone Encounter (Addendum)
Message copied by Lujean Amel on Tue Aug 01, 2011  4:37 PM ------      Message from: Alfonso Patten      Created: Tue Aug 01, 2011  2:24 PM                   ----- Message -----         From: Richrd Prime, [place]         Sent: 08/01/2011   2:16 PM           To: Alfonso Patten, RN            4 weeks F/U Dr. Oneida Alar - AVF check            Leda Gauze -needs access this week I scheduled an appt for this pt on Thurs 08/31/11 at 1:15pm. I left message for pt and mailed appt letter also. Anne Ng tobin

## 2011-08-01 NOTE — Progress Notes (Signed)
Subjective:  Back from procedures, says he feels good, stomach not bothering him at present  Objective Vital signs in last 24 hours: Filed Vitals:   07/31/11 2107 08/01/11 0515 08/01/11 0925 08/01/11 1045  BP: 128/71 114/43 110/56 128/69  Pulse: 78 76 81 85  Temp: 98.7 F (37.1 C) 98.1 F (36.7 C) 98.4 F (36.9 C) 98.2 F (36.8 C)  TempSrc: Oral Oral Oral Oral  Resp: 18 18 18 18   Height:      Weight: 98.6 kg (217 lb 6 oz)     SpO2: 100% 98% 98% 98%   Weight change: 0 kg (0 lb)  Intake/Output Summary (Last 24 hours) at 08/01/11 1238 Last data filed at 08/01/11 1052  Gross per 24 hour  Intake    240 ml  Output    400 ml  Net   -160 ml   Labs: Basic Metabolic Panel:  Lab [id number] 0824 07/31/11 0645 07/30/11 1244  NA 135 136 134*  K 3.9 4.2 3.9  CL 101 101 100  CO2 19 19 16*  GLUCOSE 103* 105* 92  BUN 114* 115* 117*  CREATININE 11.85* 10.64* 10.23*  CALCIUM 8.1* 8.3* 8.3*  ALB -- -- --  PHOS -- -- --   Liver Function Tests:  Lab 07/30/11 1244  AST 16  ALT 8  ALKPHOS 60  BILITOT 0.5  PROT 6.0  ALBUMIN 3.0*    Lab 07/30/11 1244  LIPASE 82*  AMYLASE --   No results found for this basename: AMMONIA:3 in the last 168 hours CBC:  Lab 08/01/11 0824 07/31/11 0645 07/30/11 1244  WBC 9.4 10.6* 12.2*  NEUTROABS -- -- 9.5*  HGB 10.3* 10.9* 12.8*  HCT 28.4* 30.9* 35.4*  MCV 80.9 81.5 80.8  PLT 127* 96* 94*   Cardiac Enzymes: No results found for this basename: CKTOTAL:5,CKMB:5,CKMBINDEX:5,TROPONINI:5 in the last 168 hours CBG: No results found for this basename: GLUCAP:5 in the last 168 hours  Iron Studies: No results found for this basename: IRON,TIBC,TRANSFERRIN,FERRITIN in the last 72 hours Studies/Results: No results found. Medications: Infusions:    Scheduled Medications:    . sodium chloride   Intravenous Once  . amLODipine  10 mg Oral QHS  . calcium acetate  1,334 mg Oral TID WC  . cefUROXime (ZINACEF)  IV  1.5 g Intravenous On Call to OR    . cloNIDine  0.2 mg Oral BID  . heparin  5,000 Units Subcutaneous Q8H  . minoxidil  10 mg Oral BID  . multivitamin  1 tablet Oral QHS  . nebivolol  20 mg Oral QHS  . omega-3 acid ethyl esters  1 g Oral Daily  . paricalcitol  1 mcg Oral Daily  . sodium chloride  3 mL Intravenous Q12H  . torsemide  20 mg Oral Daily    have reviewed scheduled and prn medications.  Physical Exam:  Seen later on HD General: looks good, not acutely ill  New PC Heart: RRR Lungs: mostly clear Abdomen: soft, NT Extremities: minimal edema Dialysis Access: new L upper arm AVF, good thrill and bruit. I Assessment/ Plan: Pt is a 36 y.o. yo male who was admitted on 07/30/2011 with  Uremic symptoms and hypertensive urgency.  Assessment/Plan: 1. New ESRD with uremia- unfortunately patient without predialysis planning so will need to start acutely in hospital with PC. He will get his first HD today.    Will need to get patient clipped to OP facility.  He also has demanding work hours so may fit  better to do home HD eventually.  Also an excellent candidate for transplant , patient states that he is on the list and may have possible donors ?  Will get first HD today, second on Wednesday.  Then disposition will be once he has a spot.   2. Anemia- hgb now dropping, will add aranesp.  Iron stores are pending.   4. Secondary hyperparathyroidism- on OP phoslo and zemplar.  PTH was 711 5. HTN/volume- on many antihypertensives.  Will wean some today as he will not need as much at the time of D/C  Chakita Mcgraw A   08/01/2011,12:38 PM  LOS: 2 days

## 2011-08-01 NOTE — Anesthesia Procedure Notes (Signed)
Procedure Name: LMA Insertion Date/Time: 08/01/2011 1:08 PM Performed by: Carter Kitten Pre-anesthesia Checklist: Patient identified, Timeout performed, Emergency Drugs available, Suction available and Patient being monitored Patient Re-evaluated:Patient Re-evaluated prior to inductionOxygen Delivery Method: Circle system utilized Preoxygenation: Pre-oxygenation with 100% oxygen Intubation Type: IV induction LMA: LMA inserted LMA Size: 4.0 Number of attempts: 1 Placement Confirmation: positive ETCO2 and breath sounds checked- equal and bilateral Tube secured with: Tape Dental Injury: Teeth and Oropharynx as per pre-operative assessment

## 2011-08-01 NOTE — Anesthesia Preprocedure Evaluation (Addendum)
Anesthesia Evaluation  Patient identified by MRN, date of birth, ID band Patient awake    Reviewed: Allergy & Precautions, H&P , NPO status , Patient's Chart, lab work & pertinent test results, reviewed documented beta blocker date and time   History of Anesthesia Complications Negative for: history of anesthetic complications  Airway Mallampati: I TM Distance: >3 FB Neck ROM: Full    Dental  (+) Teeth Intact and Dental Advisory Given   Pulmonary          Cardiovascular hypertension, Pt. on medications and Pt. on home beta blockers     Neuro/Psych    GI/Hepatic   Endo/Other    Renal/GU Dialysis and ESRFRenal diseaseCRF needs dialysis     Musculoskeletal   Abdominal   Peds  Hematology   Anesthesia Other Findings   Reproductive/Obstetrics                          Anesthesia Physical Anesthesia Plan  ASA: III  Anesthesia Plan: MAC   Post-op Pain Management:    Induction: Intravenous  Airway Management Planned: Natural Airway  Additional Equipment:   Intra-op Plan:   Post-operative Plan:   Informed Consent: I have reviewed the patients History and Physical, chart, labs and discussed the procedure including the risks, benefits and alternatives for the proposed anesthesia with the patient or authorized representative who has indicated his/her understanding and acceptance.   Dental advisory given  Plan Discussed with: CRNA and Surgeon  Anesthesia Plan Comments:        Anesthesia Quick Evaluation

## 2011-08-01 NOTE — Transfer of Care (Signed)
Immediate Anesthesia Transfer of Care Note  Patient: Charles [name]  Procedure(s) Performed: Procedure(s) (LRB): ARTERIOVENOUS (AV) FISTULA CREATION (Left) INSERTION OF DIALYSIS CATHETER (Right)  Patient Location: PACU  Anesthesia Type: General  Level of Consciousness: awake and alert   Airway & Oxygen Therapy: Patient Spontanous Breathing and Patient connected to nasal cannula oxygen  Post-op Assessment: Report given to PACU RN and Post -op Vital signs reviewed and stable  Post vital signs: Reviewed and stable  Complications: No apparent anesthesia complications

## 2011-08-01 NOTE — Progress Notes (Signed)
Subjective: Patient feels well. No c/o, no acute event overnight.  Objective: Vital signs in last 24 hours: Filed Vitals:   07/31/11 2107 08/01/11 0515 08/01/11 0925 08/01/11 1045  BP: 128/71 114/43 110/56 128/69  Pulse: 78 76 81 85  Temp: 98.7 F (37.1 C) 98.1 F (36.7 C) 98.4 F (36.9 C) 98.2 F (36.8 C)  TempSrc: Oral Oral Oral Oral  Resp: 18 18 18 18   Height:      Weight: 217 lb 6 oz (98.6 kg)     SpO2: 100% 98% 98% 98%   Weight change: 0 lb (0 kg)  Intake/Output Summary (Last 24 hours) at 08/01/11 1409 Last data filed at 08/01/11 1407  Gross per 24 hour  Intake    740 ml  Output    400 ml  Net    340 ml   General: NAD  Neck: no JVD  Lungs: CTA B/L  Heart: RRR. No M/G/R  Abd: soft, BS x 4  Ext: No edema   Lab Results: Basic Metabolic Panel:  Lab [id number] 0824 07/31/11 0645  Jakavion Bilodeau 135 136  K 3.9 4.2  CL 101 101  CO2 19 19  GLUCOSE 103* 105*  BUN 114* 115*  CREATININE 11.85* 10.64*  CALCIUM 8.1* 8.3*  MG -- --  PHOS -- --   Liver Function Tests:  Lab 07/30/11 1244  AST 16  ALT 8  ALKPHOS 60  BILITOT 0.5  PROT 6.0  ALBUMIN 3.0*    Lab 07/30/11 1244  LIPASE 82*  AMYLASE --   CBC:  Lab 08/01/11 0824 07/31/11 0645 07/30/11 1244  WBC 9.4 10.6* --  NEUTROABS -- -- 9.5*  HGB 10.3* 10.9* --  HCT 28.4* 30.9* --  MCV 80.9 81.5 --  PLT 127* 96* --    Urinalysis:  Lab 07/30/11 1303  COLORURINE YELLOW  LABSPEC 1.015  PHURINE 5.0  GLUCOSEU NEGATIVE  HGBUR MODERATE*  BILIRUBINUR NEGATIVE  KETONESUR NEGATIVE  PROTEINUR 100*  UROBILINOGEN 0.2  NITRITE NEGATIVE  LEUKOCYTESUR NEGATIVE    Micro Results: Recent Results (from the past 240 hour(s))  MRSA PCR SCREENING     Status: Normal   Collection Time   07/30/11  6:02 PM      Component Value Range Status Comment   MRSA by PCR NEGATIVE  NEGATIVE  Final   SURGICAL PCR SCREEN     Status: Normal   Collection Time   08/01/11 12:25 AM      Component Value Range Status Comment   MRSA, PCR  NEGATIVE  NEGATIVE  Final    Staphylococcus aureus NEGATIVE  NEGATIVE  Final    Studies/Results: No results found. Medications: I have reviewed the patient's current medications. Scheduled Meds:   . sodium chloride   Intravenous Once  . amLODipine  10 mg Oral QHS  . calcium acetate  1,334 mg Oral TID WC  . cefUROXime (ZINACEF)  IV  1.5 g Intravenous On Call to OR  . cloNIDine  0.1 mg Oral BID  . heparin  5,000 Units Subcutaneous Q8H  . multivitamin  1 tablet Oral QHS  . nebivolol  20 mg Oral QHS  . omega-3 acid ethyl esters  1 g Oral Daily  . paricalcitol  1 mcg Oral Daily  . sodium chloride  3 mL Intravenous Q12H  . torsemide  20 mg Oral Daily  . DISCONTD: cloNIDine  0.2 mg Oral BID  . DISCONTD: minoxidil  10 mg Oral BID   Continuous Infusions:  PRN Meds:.sodium chloride, sodium  chloride, sodium chloride, 0.9 % irrigation (POUR BTL), acetaminophen, acetaminophen, alteplase, feeding supplement (NEPRO CARB STEADY), heparin 6000 unit irrigation, heparin, heparin, heparin, heparin, labetalol, lidocaine, lidocaine, lidocaine-prilocaine, morphine injection, ondansetron (ZOFRAN) IV, ondansetron, pentafluoroprop-tetrafluoroeth, sodium chloride Assessment/Plan:  #. 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  Patient presents with BUN 117 and Cr. 10.23 ( his BUN 66 and CR 7.2 on 06/28/2011).   Plan  - Symptomatic management including antiemesis and pain management when necessary  - Appreciate renal and VVS consult  - Patient is scheduled to have placement of HD catheter and AVF by Dr. Oneida Alar today - Patient will receive his first hemodialysis today - Patient is scheduled to have kidney transplant in June or July of 2013. His wife is reportedly a perfect donor match.   #. Pure AG metabolic acidosis, delta/delta ratio is 1  AG 15<16 < 18  CO2 19< 16    - HD today  #. Accelerated hypertension  The patient presented with blood pressure to 240's/140's on  admission, which trended down to SBP 140's-150's Overnight.  He is noted to have fairly low SBP last night at 99-110's without any symptoms.  -He may not need this many oral antihypertensive medications, especially with his HD -  Will stop his clonidine and Torsemide - continue norvasc and Bystolic.   # Thrombocytopenia  He was noted to have serum platelets of 94-96 on admission.  The etiology of his thrombocytopenia is likely due to his CKD.  #. Anemia of chronic kidney failure  This is his chronic problem. His H&H is 12.8/35.4. No urgent need for intervention.  #. Metabolic bone disease  Chronic problem. On vitamin D and calcium treatment.  # Secondary hyperparathyroidism, likely associated with chronic kidney disease  - On calcium supplement treatment  # VTE: Heparin.     LOS: 2 days   Charles [name] 08/01/2011, 2:09 PM

## 2011-08-01 NOTE — Anesthesia Postprocedure Evaluation (Signed)
  Anesthesia Post-op Note  Patient: Charles [name]  Procedure(s) Performed: Procedure(s) (LRB): ARTERIOVENOUS (AV) FISTULA CREATION (Left) INSERTION OF DIALYSIS CATHETER (Right)  Patient Location: PACU  Anesthesia Type: General  Level of Consciousness: awake  Airway and Oxygen Therapy: Patient Spontanous Breathing  Post-op Pain: mild  Post-op Assessment: Post-op Vital signs reviewed, Patient's Cardiovascular Status Stable, Respiratory Function Stable, Patent Airway, No signs of Nausea or vomiting and Pain level controlled  Post-op Vital Signs: stable  Complications: No apparent anesthesia complications

## 2011-08-02 ENCOUNTER — Inpatient Hospital Stay (HOSPITAL_COMMUNITY): Payer: BC Managed Care – PPO

## 2011-08-02 ENCOUNTER — Encounter (HOSPITAL_COMMUNITY): Payer: Self-pay | Admitting: Vascular Surgery

## 2011-08-02 LAB — CBC
HCT: 26.6 % — ABNORMAL LOW (ref 39.0–52.0)
Hemoglobin: 9.5 g/dL — ABNORMAL LOW (ref 13.0–17.0)
MCH: 29.1 pg (ref 26.0–34.0)
MCHC: 35.7 g/dL (ref 30.0–36.0)
RBC: 3.27 MIL/uL — ABNORMAL LOW (ref 4.22–5.81)

## 2011-08-02 LAB — RENAL FUNCTION PANEL
BUN: 89 mg/dL — ABNORMAL HIGH (ref 6–23)
CO2: 22 mEq/L (ref 19–32)
Glucose, Bld: 99 mg/dL (ref 70–99)
Phosphorus: 6.8 mg/dL — ABNORMAL HIGH (ref 2.3–4.6)
Potassium: 3.3 mEq/L — ABNORMAL LOW (ref 3.5–5.1)

## 2011-08-02 LAB — IRON AND TIBC
Iron: 40 ug/dL — ABNORMAL LOW (ref 42–135)
Saturation Ratios: 23 % (ref 20–55)
UIBC: 134 ug/dL (ref 125–400)

## 2011-08-02 LAB — HEPATITIS B SURFACE ANTIBODY,QUALITATIVE: Hep B S Ab: NEGATIVE

## 2011-08-02 MED ORDER — PARICALCITOL 5 MCG/ML IV SOLN
INTRAVENOUS | Status: AC
  Administered 2011-08-02: 1 ug
  Filled 2011-08-02: qty 1

## 2011-08-02 MED ORDER — DARBEPOETIN ALFA-POLYSORBATE 100 MCG/0.5ML IJ SOLN
INTRAMUSCULAR | Status: AC
  Administered 2011-08-02: 100 ug via INTRAVENOUS
  Filled 2011-08-02: qty 0.5

## 2011-08-02 MED ORDER — HEPARIN SODIUM (PORCINE) 1000 UNIT/ML DIALYSIS: 20.0000 [IU]/kg | INTRAMUSCULAR | Status: DC | PRN

## 2011-08-02 MED ORDER — HEPARIN SODIUM (PORCINE) 1000 UNIT/ML DIALYSIS
20.0000 [IU]/kg | INTRAMUSCULAR | Status: DC | PRN
  Filled 2011-08-02: qty 2

## 2011-08-02 MED ORDER — PARICALCITOL 5 MCG/ML IV SOLN
5.0000 ug | INTRAVENOUS | Status: DC
  Filled 2011-08-02: qty 1

## 2011-08-02 NOTE — Progress Notes (Signed)
08/02/2011 2:25 PM Hemodialysis outpatient note; this patient has been accepted at the [facility] unit on a TTS 1st shift schedule. The center will begin regular treatment on Tues 05.21.13 at 0645. They need him to come in on Monday 05.20.13 at 1000 to sign paperwork. Drs. Moshe Cipro and Li aware as well as Linna Hoff NP. Thank you. Gordy Savers

## 2011-08-02 NOTE — Progress Notes (Signed)
Subjective: Patient feels well. Tolerated HD well. No c/o, no acute event overnight.  He had one HD treatment last night and one more this am.  Objective: Vital signs in last 24 hours: Filed Vitals:   08/02/11 0830 08/02/11 0900 08/02/11 0930 08/02/11 0948  BP: 151/71 151/74 161/82 140/70  Pulse: 76 79 77 78  Temp:    98 F (36.7 C)  TempSrc:    Oral  Resp:    18  Height:      Weight:    209 lb 5.2 oz (94.95 kg)  SpO2:       Weight change: 1 lb 5.2 oz (0.6 kg)  Intake/Output Summary (Last 24 hours) at 08/02/11 1314 Last data filed at 08/02/11 0948  Gross per 24 hour  Intake    700 ml  Output   3291 ml  Net  -2591 ml   General: NAD  Neck: no JVD  Lungs: CTA B/L , right chest HD catheter Heart: RRR. No M/G/R  Abd: soft, BS x 4  Ext: No edema  Lab Results: Basic Metabolic Panel:  Lab [id number] 0713 08/01/11 2009  Charles [name] 138 137  K 3.3* 3.4*  CL 102 102  CO2 22 18*  GLUCOSE 99 99  BUN 89* 116*  CREATININE 9.62* 11.76*  CALCIUM 8.1* 8.2*  MG -- --  PHOS 6.8* 7.8*   Liver Function Tests:  Lab 08/02/11 0713 08/01/11 2009 07/30/11 1244  AST -- -- 16  ALT -- -- 8  ALKPHOS -- -- 60  BILITOT -- -- 0.5  PROT -- -- 6.0  ALBUMIN 2.7* 3.0* --    Lab 07/30/11 1244  LIPASE 82*  AMYLASE --   CBC:  Lab 08/02/11 0713 08/01/11 2017 07/30/11 1244  WBC 9.0 11.4* --  NEUTROABS -- -- 9.5*  HGB 9.5* 10.2* --  HCT 26.6* 28.5* --  MCV 81.3 81.7 --  PLT 153 143* --   Anemia Panel:  Lab 08/01/11 2017  VITAMINB12 --  FOLATE --  FERRITIN --  TIBC 174*  IRON 40*  RETICCTPCT --   Urinalysis:  Lab 07/30/11 1303  COLORURINE YELLOW  LABSPEC 1.015  PHURINE 5.0  GLUCOSEU NEGATIVE  HGBUR MODERATE*  BILIRUBINUR NEGATIVE  KETONESUR NEGATIVE  PROTEINUR 100*  UROBILINOGEN 0.2  NITRITE NEGATIVE  LEUKOCYTESUR NEGATIVE    Micro Results: Recent Results (from the past 240 hour(s))  MRSA PCR SCREENING     Status: Normal   Collection Time   07/30/11  6:02 PM   Component Value Range Status Comment   MRSA by PCR NEGATIVE  NEGATIVE  Final   SURGICAL PCR SCREEN     Status: Normal   Collection Time   08/01/11 12:25 AM      Component Value Range Status Comment   MRSA, PCR NEGATIVE  NEGATIVE  Final    Staphylococcus aureus NEGATIVE  NEGATIVE  Final    Studies/Results: Dg Chest Port 1 View  08/01/2011  *RADIOLOGY REPORT*  Clinical Data: As post dialysis catheter  PORTABLE CHEST - 1 VIEW  Comparison: None.  Findings: A dual lumen dialysis catheter is present from the right with the tips in the lower SVC near the expected right atrial junction.  No pneumothorax is seen.  The lungs are clear. Cardiomegaly is noted.  IMPRESSION: Right-sided dialysis catheter tips near expected SVC - RA junction. No pneumothorax.  Original Report Authenticated By: Joretta Bachelor, M.D.   Dg Fluoro Guide Cv Line-no Report  08/01/2011  CLINICAL DATA: needed for case  FLOURO GUIDE CV LINE  Fluoroscopy was utilized by the requesting physician.  No radiographic  interpretation.     Medications: I have reviewed the patient's current medications. Scheduled Meds:   . amLODipine  10 mg Oral QHS  . calcium acetate  1,334 mg Oral TID WC  . darbepoetin (ARANESP) injection - DIALYSIS  100 mcg Intravenous Q Wed-HD  . heparin  5,000 Units Subcutaneous Q8H  . multivitamin  1 tablet Oral QHS  . nebivolol  20 mg Oral QHS  . omega-3 acid ethyl esters  1 g Oral Daily  . paricalcitol      . paricalcitol  5 mcg Intravenous Q M,W,F-HD  . sodium chloride  3 mL Intravenous Q12H  . DISCONTD: sodium chloride   Intravenous Once  . DISCONTD: cloNIDine  0.1 mg Oral BID  . DISCONTD: paricalcitol  1 mcg Oral Daily  . DISCONTD: torsemide  20 mg Oral Daily   Continuous Infusions:  PRN Meds:.sodium chloride, sodium chloride, sodium chloride, acetaminophen, acetaminophen, feeding supplement (NEPRO CARB STEADY), heparin, heparin, HYDROmorphone (DILAUDID) injection, labetalol, lidocaine,  lidocaine-prilocaine, morphine injection, morphine injection, ondansetron (ZOFRAN) IV, ondansetron (ZOFRAN) IV, ondansetron, pentafluoroprop-tetrafluoroeth, sodium chloride, DISCONTD: 0.9 % irrigation (POUR BTL), DISCONTD: heparin 6000 unit irrigation DISCONTD: heparin, DISCONTD: heparin, DISCONTD: heparin, DISCONTD: heparin, DISCONTD: lidocaine Assessment/Plan:  #. Uremia syndrome in the setting of chronic kidney disease, stage V  uremia syndrome resolved after initiation of HD.   Plan  - Symptomatic management including antiemesis and pain management when necessary  - HD per renal>>> HD in am and ? D/C home -CLIP is done - Patient is scheduled to have kidney transplant in June or July of 2013. His wife is reportedly a perfect donor match.   #. Pure AG metabolic acidosis, delta/delta ratio is 1, improving - HD per renal  #. Accelerated hypertension  The patient presented with blood pressure to 240's/140's on admission, which trended down to SBP 140's-150's Overnight.  He will likely need less antihypertensive medications with initiation of HD.  - stop clonidine and Torsemide  - continue norvasc and Bystolic.   # Thrombocytopenia  He was noted to have serum platelets of 94-96 on admission.  The etiology of his thrombocytopenia is likely due to his CKD.  #. Anemia of chronic kidney failure  This is his chronic problem. His H&H is 12.8/35.4. No urgent need for intervention.  #. Metabolic bone disease  Chronic problem. On vitamin D and calcium treatment.  # Secondary hyperparathyroidism, likely associated with chronic kidney disease  - On calcium supplement treatment  # VTE: Heparin.       LOS: 3 days   Charles [name] 08/02/2011, 1:14 PM

## 2011-08-02 NOTE — Progress Notes (Signed)
[facility]  Subjective  - Some arm soreness  Objective 140/70 78 98 F (36.7 C) (Oral) 18 98%  Intake/Output Summary (Last 24 hours) at 08/02/11 1504 Last data filed at 08/02/11 0948  Gross per 24 hour  Intake      0 ml  Output   3291 ml  Net  -3291 ml   Left arm +thrill No numbness or tingling No hematoma, incision clean  Assessment/Planning: Functioning fistula will arrange follow up in 1 month  Amel Gianino E 08/02/2011 3:04 PM --  Laboratory Lab Results:  [facility] 08/02/11 0713 08/01/11 2017  WBC 9.0 11.4*  HGB 9.5* 10.2*  HCT 26.6* 28.5*  PLT 153 143*   BMET  Basename 08/02/11 0713 08/01/11 2017  NA 138 --  K 3.3* --  CL 102 --  CO2 22 --  GLUCOSE 99 --  BUN 89* --  CREATININE 9.62* --  CALCIUM 8.1* 8.0*    COAG No results found for this basename: INR, PROTIME   No results found for this basename: PTT    Antibiotics Anti-infectives     Start     Dose/Rate Route Frequency Ordered Stop   08/01/11 0400   cefUROXime (ZINACEF) 1.5 g in dextrose 5 % 50 mL IVPB        1.5 g 100 mL/hr over 30 Minutes Intravenous On call to O.R. 07/31/11 1311 08/01/11 1213

## 2011-08-02 NOTE — Progress Notes (Signed)
Subjective:  HD done now last night and this AM.  Looks great and feels well.    Objective Vital signs in last 24 hours: Filed Vitals:   08/02/11 0830 08/02/11 0900 08/02/11 0930 08/02/11 0948  BP: 151/71 151/74 161/82 140/70  Pulse: 76 79 77 78  Temp:    98 F (36.7 C)  TempSrc:    Oral  Resp:    18  Height:      Weight:    94.95 kg (209 lb 5.2 oz)  SpO2:       Weight change: 0.6 kg (1 lb 5.2 oz)  Intake/Output Summary (Last 24 hours) at 08/02/11 1120 Last data filed at 08/02/11 0948  Gross per 24 hour  Intake    700 ml  Output   3291 ml  Net  -2591 ml   Labs: Basic Metabolic Panel:  Lab [id number] 0713 08/01/11 2009 08/01/11 0824  NA 138 137 135  K 3.3* 3.4* 3.9  CL 102 102 101  CO2 22 18* 19  GLUCOSE 99 99 103*  BUN 89* 116* 114*  CREATININE 9.62* 11.76* 11.85*  CALCIUM 8.1* 8.2* 8.1*  ALB -- -- --  PHOS 6.8* 7.8* --   Liver Function Tests:  Lab 08/02/11 0713 08/01/11 2009 07/30/11 1244  AST -- -- 16  ALT -- -- 8  ALKPHOS -- -- 60  BILITOT -- -- 0.5  PROT -- -- 6.0  ALBUMIN 2.7* 3.0* 3.0*    Lab 07/30/11 1244  LIPASE 82*  AMYLASE --   No results found for this basename: AMMONIA:3 in the last 168 hours CBC:  Lab 08/02/11 0713 08/01/11 2017 08/01/11 0824 07/31/11 0645 07/30/11 1244  WBC 9.0 11.4* 9.4 -- --  NEUTROABS -- -- -- -- 9.5*  HGB 9.5* 10.2* 10.3* -- --  HCT 26.6* 28.5* 28.4* -- --  MCV 81.3 81.7 80.9 81.5 80.8  PLT 153 143* 127* -- --   Cardiac Enzymes: No results found for this basename: CKTOTAL:5,CKMB:5,CKMBINDEX:5,TROPONINI:5 in the last 168 hours CBG: No results found for this basename: GLUCAP:5 in the last 168 hours  Iron Studies:   Basename 08/01/11 2017  IRON 40*  TIBC 174*  TRANSFERRIN --  FERRITIN --   Studies/Results: Dg Chest Port 1 View  08/01/2011  *RADIOLOGY REPORT*  Clinical Data: As post dialysis catheter  PORTABLE CHEST - 1 VIEW  Comparison: None.  Findings: A dual lumen dialysis catheter is present from the  right with the tips in the lower SVC near the expected right atrial junction.  No pneumothorax is seen.  The lungs are clear. Cardiomegaly is noted.  IMPRESSION: Right-sided dialysis catheter tips near expected SVC - RA junction. No pneumothorax.  Original Report Authenticated By: Joretta Bachelor, M.D.   Dg Fluoro Guide Cv Line-no Report  08/01/2011  CLINICAL DATA: needed for case   FLOURO GUIDE CV LINE  Fluoroscopy was utilized by the requesting physician.  No radiographic  interpretation.     Medications: Infusions:    Scheduled Medications:    . amLODipine  10 mg Oral QHS  . calcium acetate  1,334 mg Oral TID WC  . cefUROXime (ZINACEF)  IV  1.5 g Intravenous On Call to OR  . darbepoetin (ARANESP) injection - DIALYSIS  100 mcg Intravenous Q Wed-HD  . heparin  5,000 Units Subcutaneous Q8H  . multivitamin  1 tablet Oral QHS  . nebivolol  20 mg Oral QHS  . omega-3 acid ethyl esters  1 g Oral Daily  .  paricalcitol      . paricalcitol  1 mcg Oral Daily  . sodium chloride  3 mL Intravenous Q12H  . DISCONTD: sodium chloride   Intravenous Once  . DISCONTD: cloNIDine  0.1 mg Oral BID  . DISCONTD: cloNIDine  0.2 mg Oral BID  . DISCONTD: minoxidil  10 mg Oral BID  . DISCONTD: torsemide  20 mg Oral Daily    have reviewed scheduled and prn medications.  Physical Exam:  Seen later on HD General: looks good, not acutely ill  New PC Heart: RRR Lungs: mostly clear Abdomen: soft, NT Extremities: minimal edema Dialysis Access: new L upper arm AVF, good thrill and bruit. I Assessment/ Plan: Pt is a 36 y.o. yo male who was admitted on 07/30/2011 with  Uremic symptoms and hypertensive urgency.  Assessment/Plan: 1. New ESRD with uremia- unfortunately patient without predialysis planning so will need to start acutely in hospital with PC. He already has had 2 treatments, will plan for third tomorrow.  Will need to get patient clipped to OP facility.  He wants an early AM spot at [facility].   Then  disposition will be once he has a spot- hopefully will be possible to d/c in next day or 2  2. Anemia- hgb now dropping, will add aranesp.  Iron stores are pending.   4. Secondary hyperparathyroidism- on OP phoslo and zemplar.  PTH was 711 5. HTN/volume- on many antihypertensives.  Will wean some today as he will not need as much at the time of D/C  Analisa Sledd A   08/02/2011,11:20 AM  LOS: 3 days

## 2011-08-03 ENCOUNTER — Inpatient Hospital Stay (HOSPITAL_COMMUNITY): Payer: BC Managed Care – PPO

## 2011-08-03 LAB — RENAL FUNCTION PANEL
CO2: 24 mEq/L (ref 19–32)
Calcium: 8.9 mg/dL (ref 8.4–10.5)
Chloride: 101 mEq/L (ref 96–112)
GFR calc Af Amer: 8 mL/min — ABNORMAL LOW (ref >90)
GFR calc non Af Amer: 7 mL/min — ABNORMAL LOW (ref >90)
Glucose, Bld: 94 mg/dL (ref 70–99)
Potassium: 3.2 mEq/L — ABNORMAL LOW (ref 3.5–5.1)
Sodium: 139 mEq/L (ref 135–145)

## 2011-08-03 LAB — CBC
Hemoglobin: 10.4 g/dL — ABNORMAL LOW (ref 13.0–17.0)
Platelets: 191 10*3/uL (ref 150–400)
RBC: 3.54 MIL/uL — ABNORMAL LOW (ref 4.22–5.81)
WBC: 8.8 10*3/uL (ref 4.0–10.5)

## 2011-08-03 LAB — TSH: TSH: 0.476 u[IU]/mL (ref 0.350–4.500)

## 2011-08-03 MED ORDER — NEBIVOLOL HCL 20 MG PO TABS: 20.0000 mg | ORAL_TABLET | Freq: Every day | ORAL | Status: DC

## 2011-08-03 MED ORDER — CALCIUM ACETATE 667 MG PO CAPS: 1334.0000 mg | ORAL_CAPSULE | Freq: Three times a day (TID) | ORAL | Status: AC

## 2011-08-03 MED ORDER — AMLODIPINE BESYLATE 10 MG PO TABS: 10.0000 mg | ORAL_TABLET | Freq: Every day | ORAL | Status: DC

## 2011-08-03 MED ORDER — NEPRO/CARBSTEADY PO LIQD: 237.0000 mL | Freq: Two times a day (BID) | ORAL | Status: DC

## 2011-08-03 MED ORDER — PARICALCITOL 5 MCG/ML IV SOLN
INTRAVENOUS | Status: AC
  Filled 2011-08-03: qty 1

## 2011-08-03 NOTE — Progress Notes (Signed)
Subjective: Patient feels well. Tolerated HD well. No c/o, no acute event overnight.  He is in HD now without problems.  Objective: Vital signs in last 24 hours: Filed Vitals:   08/03/11 0704 08/03/11 0705 08/03/11 0717 08/03/11 0738  BP: 165/94 160/94 164/97 161/99  Pulse: 75 75 74 74  Temp:  98.3 F (36.8 C)    TempSrc:  Oral    Resp: 17 18 17 18   Height:      Weight:  209 lb 14.1 oz (95.2 kg)    SpO2:  98%     Weight change: -9 lb 5.9 oz (-4.25 kg)  Intake/Output Summary (Last 24 hours) at 08/03/11 0815 Last data filed at 08/02/11 0948  Gross per 24 hour  Intake      0 ml  Output   1691 ml  Net  -1691 ml   General: NAD  Neck: no JVD  Lungs: CTA B/L , right chest HD catheter  Heart: RRR. No M/G/R  Abd: soft, BS x 4  Ext: No edema. Left AC area AVF with bruit noted  Lab Results: Basic Metabolic Panel:  Lab [id number] 0713 08/01/11 2017 08/01/11 2009  Timarie Labell 138 -- 137  K 3.3* -- 3.4*  CL 102 -- 102  CO2 22 -- 18*  GLUCOSE 99 -- 99  BUN 89* -- 116*  CREATININE 9.62* -- 11.76*  CALCIUM 8.1* 8.0* --  MG -- -- --  PHOS 6.8* -- 7.8*   Liver Function Tests:  Lab 08/02/11 0713 08/01/11 2009 07/30/11 1244  AST -- -- 16  ALT -- -- 8  ALKPHOS -- -- 60  BILITOT -- -- 0.5  PROT -- -- 6.0  ALBUMIN 2.7* 3.0* --    Lab 07/30/11 1244  LIPASE 82*  AMYLASE --   CBC:  Lab 08/02/11 0713 08/01/11 2017 07/30/11 1244  WBC 9.0 11.4* --  NEUTROABS -- -- 9.5*  HGB 9.5* 10.2* --  HCT 26.6* 28.5* --  MCV 81.3 81.7 --  PLT 153 143* --   Anemia Panel:  Lab 08/01/11 2017  VITAMINB12 --  FOLATE --  FERRITIN --  TIBC 174*  IRON 40*  RETICCTPCT --   Urinalysis:  Lab 07/30/11 1303  COLORURINE YELLOW  LABSPEC 1.015  PHURINE 5.0  GLUCOSEU NEGATIVE  HGBUR MODERATE*  BILIRUBINUR NEGATIVE  KETONESUR NEGATIVE  PROTEINUR 100*  UROBILINOGEN 0.2  NITRITE NEGATIVE  LEUKOCYTESUR NEGATIVE    Micro Results: Recent Results (from the past 240 hour(s))  MRSA PCR  SCREENING     Status: Normal   Collection Time   07/30/11  6:02 PM      Component Value Range Status Comment   MRSA by PCR NEGATIVE  NEGATIVE  Final   SURGICAL PCR SCREEN     Status: Normal   Collection Time   08/01/11 12:25 AM      Component Value Range Status Comment   MRSA, PCR NEGATIVE  NEGATIVE  Final    Staphylococcus aureus NEGATIVE  NEGATIVE  Final    Studies/Results: Dg Chest Port 1 View  08/01/2011  *RADIOLOGY REPORT*  Clinical Data: As post dialysis catheter  PORTABLE CHEST - 1 VIEW  Comparison: None.  Findings: A dual lumen dialysis catheter is present from the right with the tips in the lower SVC near the expected right atrial junction.  No pneumothorax is seen.  The lungs are clear. Cardiomegaly is noted.  IMPRESSION: Right-sided dialysis catheter tips near expected SVC - RA junction. No pneumothorax.  Original Report Authenticated  By: Joretta Bachelor, M.D.   Dg Fluoro Guide Cv Line-no Report  08/01/2011  CLINICAL DATA: needed for case   FLOURO GUIDE CV LINE  Fluoroscopy was utilized by the requesting physician.  No radiographic  interpretation.     Medications:  I have reviewed the patient's current medications. Prior to Admission:  Prescriptions prior to admission  Medication Sig Dispense Refill  . amLODipine (NORVASC) 10 MG tablet Take 10 mg by mouth daily.      . Calcium Carbonate-Vitamin D (CALCIUM 600+D PO) Take 1 tablet by mouth daily.       . cloNIDine (CATAPRES) 0.2 MG tablet Take 0.2 mg by mouth 2 (two) times daily.      . fish oil-omega-3 fatty acids 1000 MG capsule Take 1 g by mouth daily.      . minoxidil (LONITEN) 10 MG tablet Take 10 mg by mouth daily.      . Nebivolol HCl (BYSTOLIC) 20 MG TABS Take 20 mg by mouth daily.       . paricalcitol (ZEMPLAR) 1 MCG capsule Take 1 mcg by mouth daily.      Marland Kitchen torsemide (DEMADEX) 20 MG tablet Take 20 mg by mouth daily.      . Vitamin D, Ergocalciferol, (DRISDOL) 50000 UNITS CAPS Take 50,000 Units by mouth every 7 (seven)  days. Day of the week varies - last week Tuesday      . Iron TABS Take 1 tablet by mouth daily.        Scheduled Meds:   . amLODipine  10 mg Oral QHS  . calcium acetate  1,334 mg Oral TID WC  . darbepoetin (ARANESP) injection - DIALYSIS  100 mcg Intravenous Q Wed-HD  . heparin  5,000 Units Subcutaneous Q8H  . multivitamin  1 tablet Oral QHS  . nebivolol  20 mg Oral QHS  . omega-3 acid ethyl esters  1 g Oral Daily  . paricalcitol      . paricalcitol  5 mcg Intravenous Q M,W,F-HD  . sodium chloride  3 mL Intravenous Q12H  . DISCONTD: paricalcitol  1 mcg Oral Daily   Continuous Infusions:  PRN Meds:.sodium chloride, sodium chloride, sodium chloride, acetaminophen, acetaminophen, feeding supplement (NEPRO CARB STEADY), heparin, heparin, HYDROmorphone (DILAUDID) injection, labetalol, lidocaine, lidocaine-prilocaine, morphine injection, morphine injection, ondansetron (ZOFRAN) IV, ondansetron, pentafluoroprop-tetrafluoroeth, sodium chloride, DISCONTD: heparin, DISCONTD: heparin, DISCONTD: heparin Assessment/Plan: #. Uremia syndrome in the setting of chronic kidney disease, stage V  uremia syndrome resolved after initiation of HD.   Plan    - HD per renal>>>  D/C home after HD today -CLIP is done  - Patient is scheduled to have kidney transplant in June or July of 2013. His wife is reportedly a perfect donor match.   #. Pure AG metabolic acidosis, delta/delta ratio is 1, resolved - HD per renal  #. Accelerated hypertension  The patient presented with blood pressure to 240's/140's on admission, which trended down to SBP 140's-150's Overnight.  He will likely need less antihypertensive medications with initiation of HD.  - stop clonidine and Torsemide  - continue norvasc and Bystolic.  # Thrombocytopenia  He was noted to have serum platelets of 94-96 on admission.  The etiology of his thrombocytopenia is likely due to his CKD.  #. Anemia of chronic kidney failure  This is his chronic  problem. His H&H is 12.8/35.4. No urgent need for intervention.  #. Metabolic bone disease  Chronic problem. On vitamin D and calcium treatment.  # Secondary hyperparathyroidism,  likely associated with chronic kidney disease  - On calcium supplement treatment  # VTE: Heparin.        LOS: 4 days   Barbarajean Kinzler 08/03/2011, 8:15 AM

## 2011-08-03 NOTE — Discharge Instructions (Signed)
   1. Please only take Bystolic and Norvasc for your BP 2. Please stop Clonidine, Minoxidil and Torsemide 3. Please follow up with Dr. Posey Pronto as scheduled 4. Please follow up with vascular surgeon Dr. Oneida Alar, office will call you for appt. 5. Please follow up with your hemodialysis schedule on Tuesday.

## 2011-08-03 NOTE — Progress Notes (Signed)
Discussed discharge instructions with pt. Pt showed no barriers to discharge. Gave information about renal diet to patient. IV removed. Pt awaiting ride from wife, will be discharged home with her. Assessment unchanged from morning.

## 2011-08-03 NOTE — Discharge Summary (Signed)
[facility] Discharge Note  Name: Charles [name] MRN: [id number] DOB: 1975/10/02 36 y.o.  Date of Admission: 07/30/2011 12:04 PM Date of Discharge: 08/03/2011 Attending Physician: Bartholomew Crews, MD  Discharge Diagnosis: 1. Uremia syndrome 2. Metabolic acidosis, increased AG 3. Accelerated Hypertension 4. End stage renal disease on Hemodislysis 5. Thrombocytopenia, resolved 6.  Anemia of chronic kidney failure 7.  Metabolic bone disease 8. Secondary hyperparathroidism   Discharge Medications: Medication List  As of 08/03/2011  1:58 PM   STOP taking these medications         cloNIDine 0.2 MG tablet      minoxidil 10 MG tablet      paricalcitol 1 MCG capsule      torsemide 20 MG tablet         TAKE these medications         amLODipine 10 MG tablet   Commonly known as: NORVASC   Take 1 tablet (10 mg total) by mouth daily.      CALCIUM 600+D PO   Take 1 tablet by mouth daily.      calcium acetate 667 MG capsule   Commonly known as: PHOSLO   Take 2 capsules (1,334 mg total) by mouth 3 (three) times daily with meals.      feeding supplement (NEPRO CARB STEADY) Liqd   Take 237 mLs by mouth 2 (two) times daily between meals.      fish oil-omega-3 fatty acids 1000 MG capsule   Take 1 g by mouth daily.      Iron Tabs   Take 1 tablet by mouth daily.      Nebivolol HCl 20 MG Tabs   Take 1 tablet (20 mg total) by mouth daily.      Vitamin D (Ergocalciferol) 50000 UNITS Caps   Commonly known as: DRISDOL   Take 50,000 Units by mouth every 7 (seven) days. Day of the week varies - last week Tuesday            Disposition and follow-up:   Charles [name] was discharged from [facility] in Stable condition.   Follow-up Appointments: Follow-up Information    Follow up with Charles [name]., MD on 09/05/2011. (at 415pm )    Contact information:   [place]  [place] [phone]     please followup with his blood pressure and his hemodialysis .   Follow up with Charles Dutch, MD. (in 4 weeks, the office will call you for an appointment)    Contact information:   [address] [place] [phone]     patient has an AV fistula placed on left arm on 08/01/11 .  Please followup .     Discharge Orders    Future Appointments: Provider: Department: Dept Phone: Center:   08/31/2011 1:15 PM Charles Dutch, MD [facility] [phone] [facility]      Consultations: Treatment Team:  Charles Sou, MD Charles [name] ([facility])  Procedures Performed:  Dg Chest Port 1 View  08/01/2011  *RADIOLOGY REPORT*  Clinical Data: As post dialysis catheter  PORTABLE CHEST - 1 VIEW  Comparison: None.  Findings: A dual lumen dialysis catheter is present from the right with the tips in the lower SVC near the expected right atrial junction.  No pneumothorax is seen.  The lungs are clear. Cardiomegaly is noted.  IMPRESSION: Right-sided dialysis catheter tips near expected SVC - RA junction. No pneumothorax.  Original  Report Authenticated By: Joretta Bachelor, M.D.   Dg Fluoro Guide Cv Line-no Report  08/01/2011  CLINICAL DATA: needed for case   FLOURO GUIDE CV LINE  Fluoroscopy was utilized by the requesting physician.  No radiographic  interpretation.     Upper Extremity Vein Mapping Bilateral on 07/31/11  Admission HPI:   This is a 36 year old man with PMH of hypertension, chronic kidney disease, metabolic bone disease and anemia of chronic disease who presents with nausea, vomiting, abdominal pain and fatigue. Patient states that he started to feel tired/fatigue and lost his energy 3 days ago. And the following day he developed nausea, mild vomiting and abdominal pain, accompanied with watery loose stools to-3 times a day. Denies bilious or bloody vomits. Denies bloody stools. His abdominal pain is located in the left upper and mid quadrant,  intermittent aching/sharp pain, 8/10 with no radiations. No aggravating or alleviating factors. Denies sick contact. Denies ingestion of unusual food. Patient did not seek any medical attention until today when he came to ED for further evaluation. Per ED chart, He was noted to have blood pressure of 230's/140's. He denies headache or blurred vision.  Of note, he reports a similar episode in March of this year, which subsided without any medical management in 3-4 days. And He was told by Charles [name] that his symptoms were related to his CKD. He was referred by Charles [name] to vascular surgery for placement of hemodialysis access in the past. However, at that time, he was hesitant to placement of dialysis access. He states that he is waiting to have hemodialysis now. He also informs me thatHis wife is a perfect match for his kidney transplant, which is presumably scheduled to be done in June or July of 2013.    Hospital Course by problem list: #. Uremia syndrome      Patient presented with gradual onset of nausea, mild vomiting, loose stools and vague abdominal pain, accompanied with generalized tiredness in the setting of progressive worsening of stage V CKD. He was noted to have BUN of 117 and creatinine 10.23 on admission. ( His BUN was 66 and a creatinine 7.2 during office visit on 06/28/11).  The etiology is likely due to uremia secondary to stage V CKD.  We consulted his nephrologist from [facility] who initiated hemodialysis treatment.  Patient had a HD catheter and left arm AVF placed by Dr. Oneida [name] on 08/01/11.  Subsequently, he had hemodialysis daily on 5/14, 5/15 and 5/16.  He has improved significantly.  He is symptom-free upon discharge.  He is stable and will be discharged home today with outpatient dialysis set up.  He will continue followup with his nephrologist as an outpatient.  He will also followup with vascular surgeon Dr. Oneida [name] in 4 weeks and the appointment is set up.  # Pure AG  metabolic acidosis     Patient presented with AG of 18 and a CO2 of 16 on admission.  His Delta-Delta was 1, which indicated Pure AG metabolic acidosis. The etiology is secondary to his chronic renal failure.  This problem is resolved after initiation of hemodialysis.  See above note.  #. End stage renal disease with Hemodialysis     Patient has had a chronic stage V CKD and he was hesitant to start the treatment of hemodialysis in the past.  He presented with symptoms of uremia syndrome during this admission.  And he was willing to start the hemodialysis. Per patient report, his wife is a  perfect donor match and he is scheduled to have kidney transplant surgery in a few months.  #. Accelerated Hypertension     Patient was noted to have extremely high blood pressure of 230s/140s on admission, which was likely due to him missing anti-hypertensive medications secondary to uremia syndrome and end-stage renal disease.  He was on amlodipine, clonidine, minoxidil, torsemide and Nebivolol at home.  We initially resumed all of his antihypertensive medications.  With the initiation of hemodialysis, his blood pressure was able to be maintained at 140-160/80-90 on amlodipine and nebivolol.  Subsequently we have stopped his clonidine, minoxidil and torsemide.  He will continue to followup with his nephrologist as an outpatient for optimal blood pressure control.  He would prefer to having his nephrologist to be his primary care physician since he only has hypertension and ESRD.   #. Thrombocytopenia, Anemia of chronic kidney failure, Metabolic bone disease, Secondary hyperparathroidism The above problems are all related to his end-stage renal disease and will be managed by his nephrologist as an outpatient.     Discharge Vitals:  BP 175/92  Pulse 78  Temp(Src) 98.2 F (36.8 C) (Oral)  Resp 20  Ht 5\' 9"  (1.753 m)  Wt 202 lb 13.2 oz (92 kg)  BMI 29.95 kg/m2  SpO2 100%  Discharge Labs:  Results for  orders placed during the hospital encounter of 07/30/11 (from the past 24 hour(s))  CBC     Status: Abnormal   Collection Time   08/03/11  8:13 AM      Component Value Range   WBC 8.8  4.0 - 10.5 (K/uL)   RBC 3.54 (*) 4.22 - 5.81 (MIL/uL)   Hemoglobin 10.4 (*) 13.0 - 17.0 (g/dL)   HCT 29.2 (*) 39.0 - 52.0 (%)   MCV 82.5  78.0 - 100.0 (fL)   MCH 29.4  26.0 - 34.0 (pg)   MCHC 35.6  30.0 - 36.0 (g/dL)   RDW 13.2  11.5 - 15.5 (%)   Platelets 191  150 - 400 (K/uL)  RENAL FUNCTION PANEL     Status: Abnormal   Collection Time   08/03/11  8:14 AM      Component Value Range   Sodium 139  135 - 145 (mEq/L)   Potassium 3.2 (*) 3.5 - 5.1 (mEq/L)   Chloride 101  96 - 112 (mEq/L)   CO2 24  19 - 32 (mEq/L)   Glucose, Bld 94  70 - 99 (mg/dL)   BUN 56 (*) 6 - 23 (mg/dL)   Creatinine, Ser 8.36 (*) 0.50 - 1.35 (mg/dL)   Calcium 8.9  8.4 - 10.5 (mg/dL)   Phosphorus 6.1 (*) 2.3 - 4.6 (mg/dL)   Albumin 3.0 (*) 3.5 - 5.2 (g/dL)   GFR calc non Af Amer 7 (*) >90 (mL/min)   GFR calc Af Amer 8 (*) >90 (mL/min)    Signed: Luevenia Mcavoy 08/03/2011, 1:58 PM   Time Spent on Discharge: 45 minutes

## 2011-08-03 NOTE — Progress Notes (Signed)
Subjective:  Now getting his third HD in a row.  Looks great and feels well.    Objective Vital signs in last 24 hours: Filed Vitals:   08/03/11 0830 08/03/11 0906 08/03/11 0930 08/03/11 1004  BP: 159/93 142/84 150/87 150/81  Pulse: 78 75 78 76  Temp:      TempSrc:      Resp: 18 17 18 18   Height:      Weight:      SpO2:       Weight change: -4.25 kg (-9 lb 5.9 oz)  Intake/Output Summary (Last 24 hours) at 08/03/11 1032 Last data filed at 08/03/11 0900  Gross per 24 hour  Intake      0 ml  Output      0 ml  Net      0 ml   Labs: Basic Metabolic Panel:  Lab [id number] 0814 08/02/11 0713 08/01/11 2017 08/01/11 2009  NA 139 138 -- 137  K 3.2* 3.3* -- 3.4*  CL 101 102 -- 102  CO2 24 22 -- 18*  GLUCOSE 94 99 -- 99  BUN 56* 89* -- 116*  CREATININE 8.36* 9.62* -- 11.76*  CALCIUM 8.9 8.1* 8.0* --  ALB -- -- -- --  PHOS 6.1* 6.8* -- 7.8*   Liver Function Tests:  Lab 08/03/11 0814 08/02/11 0713 08/01/11 2009 07/30/11 1244  AST -- -- -- 16  ALT -- -- -- 8  ALKPHOS -- -- -- 60  BILITOT -- -- -- 0.5  PROT -- -- -- 6.0  ALBUMIN 3.0* 2.7* 3.0* --    Lab 07/30/11 1244  LIPASE 82*  AMYLASE --   No results found for this basename: AMMONIA:3 in the last 168 hours CBC:  Lab 08/03/11 0813 08/02/11 0713 08/01/11 2017 08/01/11 0824 07/31/11 0645 07/30/11 1244  WBC 8.8 9.0 11.4* -- -- --  NEUTROABS -- -- -- -- -- 9.5*  HGB 10.4* 9.5* 10.2* -- -- --  HCT 29.2* 26.6* 28.5* -- -- --  MCV 82.5 81.3 81.7 80.9 81.5 --  PLT 191 153 143* -- -- --   Cardiac Enzymes: No results found for this basename: CKTOTAL:5,CKMB:5,CKMBINDEX:5,TROPONINI:5 in the last 168 hours CBG: No results found for this basename: GLUCAP:5 in the last 168 hours  Iron Studies:   Basename 08/01/11 2017  IRON 40*  TIBC 174*  TRANSFERRIN --  FERRITIN --   Studies/Results: Dg Chest Port 1 View  08/01/2011  *RADIOLOGY REPORT*  Clinical Data: As post dialysis catheter  PORTABLE CHEST - 1 VIEW  Comparison:  None.  Findings: A dual lumen dialysis catheter is present from the right with the tips in the lower SVC near the expected right atrial junction.  No pneumothorax is seen.  The lungs are clear. Cardiomegaly is noted.  IMPRESSION: Right-sided dialysis catheter tips near expected SVC - RA junction. No pneumothorax.  Original Report Authenticated By: Joretta Bachelor, M.D.   Dg Fluoro Guide Cv Line-no Report  08/01/2011  CLINICAL DATA: needed for case   FLOURO GUIDE CV LINE  Fluoroscopy was utilized by the requesting physician.  No radiographic  interpretation.     Medications: Infusions:    Scheduled Medications:    . amLODipine  10 mg Oral QHS  . calcium acetate  1,334 mg Oral TID WC  . darbepoetin (ARANESP) injection - DIALYSIS  100 mcg Intravenous Q Wed-HD  . heparin  5,000 Units Subcutaneous Q8H  . multivitamin  1 tablet Oral QHS  . nebivolol  20 mg  Oral QHS  . omega-3 acid ethyl esters  1 g Oral Daily  . paricalcitol      . paricalcitol  5 mcg Intravenous Q M,W,F-HD  . sodium chloride  3 mL Intravenous Q12H  . DISCONTD: paricalcitol  1 mcg Oral Daily    have reviewed scheduled and prn medications.  Physical Exam:  Seen later on HD General: looks good, not acutely ill  New PC Heart: RRR Lungs: mostly clear Abdomen: soft, NT Extremities: minimal edema Dialysis Access: new L upper arm AVF, good thrill and bruit. I Assessment/ Plan: Pt is a 36 y.o. yo male who was admitted on 07/30/2011 with  Uremic symptoms and hypertensive urgency.  Assessment/Plan: 1. New ESRD with uremia- unfortunately patient without predialysis planning so has had to start acutely in hospital with PC. He has had 3 HD treatments.  Set up as OP at [place] first shift.  OK for discharge today, will wait until Tuesday for next treatment as OP 2. Anemia- aranesp and add iron as OP as well  4. Secondary hyperparathyroidism- on OP phoslo and zemplar.  PTH was 711 5. HTN/volume- Meds have been weaned with  initaiation of HD, send out on what he is on now. I told him to take his torsemide on Saturday and Sunday so that he will have some coverage so wont get into trouble over the weekend.   Rodriquez Thorner A   08/03/2011,10:32 AM  LOS: 4 days

## 2011-08-09 DIAGNOSIS — D696 Thrombocytopenia, unspecified: Secondary | ICD-10-CM

## 2011-08-09 DIAGNOSIS — Z992 Dependence on renal dialysis: Secondary | ICD-10-CM

## 2011-08-09 DIAGNOSIS — N2581 Secondary hyperparathyroidism of renal origin: Secondary | ICD-10-CM

## 2011-08-09 DIAGNOSIS — E8729 Other acidosis: Secondary | ICD-10-CM

## 2011-08-09 DIAGNOSIS — N186 End stage renal disease: Secondary | ICD-10-CM

## 2011-08-09 DIAGNOSIS — E872 Acidosis: Secondary | ICD-10-CM

## 2011-08-09 HISTORY — DX: Thrombocytopenia, unspecified: D69.6

## 2011-08-09 HISTORY — DX: Dependence on renal dialysis: N18.6

## 2011-08-31 ENCOUNTER — Ambulatory Visit: Payer: Self-pay | Admitting: Vascular Surgery

## 2011-09-06 ENCOUNTER — Encounter: Payer: Self-pay | Admitting: Vascular Surgery

## 2011-09-07 ENCOUNTER — Ambulatory Visit (INDEPENDENT_AMBULATORY_CARE_PROVIDER_SITE_OTHER): Payer: BC Managed Care – PPO | Admitting: Vascular Surgery

## 2011-09-07 ENCOUNTER — Encounter: Payer: Self-pay | Admitting: Vascular Surgery

## 2011-09-07 VITALS — BP 152/101 | HR 69 | Temp 98.3°F | Ht 69.0 in | Wt 205.0 lb

## 2011-09-07 DIAGNOSIS — N186 End stage renal disease: Secondary | ICD-10-CM | POA: Insufficient documentation

## 2011-09-07 NOTE — Progress Notes (Signed)
Patient is a 36 year old male returns for followup today after placement of a left brachiocephalic fistula [id number]. He denies any numbness or tingling in his hands. He is exercising the fistula.  Filed Vitals:   09/07/11 0910  BP: 152/101  Pulse: 69  Temp: 98.3 F (36.8 C)  TempSrc: Oral  Height: 5\' 9"  (1.753 m)  Weight: 205 lb (92.987 kg)  SpO2: 100%   Left upper extremity: Left antecubital incision is well-healed. There is a palpable thrill in the fistula. The fistula is palpable throughout most of the upper arm.  Assessment: Maturing fistula left arm Plan: The fistula should be ready for use in August.  Ruta Hinds, MD [facility] Office: [phone] Pager: [phone]

## 2011-09-09 ENCOUNTER — Other Ambulatory Visit: Payer: Self-pay | Admitting: Internal Medicine

## 2011-09-11 NOTE — Telephone Encounter (Signed)
Not OPC pt.

## 2011-10-27 ENCOUNTER — Other Ambulatory Visit (HOSPITAL_COMMUNITY): Payer: Self-pay | Admitting: Nephrology

## 2011-10-27 DIAGNOSIS — N186 End stage renal disease: Secondary | ICD-10-CM

## 2011-11-01 ENCOUNTER — Ambulatory Visit (HOSPITAL_COMMUNITY)
Admission: RE | Admit: 2011-11-01 | Discharge: 2011-11-01 | Disposition: A | Discharge status: Home / Self Care | Payer: BC Managed Care – PPO | Source: Ambulatory Visit | Attending: Nephrology | Admitting: Nephrology

## 2011-11-01 DIAGNOSIS — Z452 Encounter for adjustment and management of vascular access device: Secondary | ICD-10-CM | POA: Insufficient documentation

## 2011-11-01 DIAGNOSIS — N186 End stage renal disease: Secondary | ICD-10-CM | POA: Insufficient documentation

## 2011-11-01 DIAGNOSIS — Z992 Dependence on renal dialysis: Secondary | ICD-10-CM | POA: Insufficient documentation

## 2011-11-01 MED ORDER — CHLORHEXIDINE GLUCONATE 4 % EX LIQD
CUTANEOUS | Status: AC
  Filled 2011-11-01: qty 30

## 2011-11-01 NOTE — Procedures (Signed)
Right IJ tunneled HD cath removed in its entirety without immediate complications. Medications utilized- 1% lidocaine into skin and SQ tissues. Vaseline gauze dressing applied over site.

## 2012-10-01 ENCOUNTER — Emergency Department (HOSPITAL_COMMUNITY)
Admission: EM | Admit: 2012-10-01 | Discharge: 2012-10-01 | Disposition: A | Discharge status: Home / Self Care | Payer: BC Managed Care – PPO | Attending: Emergency Medicine | Admitting: Emergency Medicine

## 2012-10-01 ENCOUNTER — Emergency Department (HOSPITAL_COMMUNITY): Payer: BC Managed Care – PPO

## 2012-10-01 ENCOUNTER — Encounter (HOSPITAL_COMMUNITY): Payer: Self-pay | Admitting: Emergency Medicine

## 2012-10-01 DIAGNOSIS — S43005A Unspecified dislocation of left shoulder joint, initial encounter: Secondary | ICD-10-CM

## 2012-10-01 DIAGNOSIS — Y9389 Activity, other specified: Secondary | ICD-10-CM | POA: Insufficient documentation

## 2012-10-01 DIAGNOSIS — I129 Hypertensive chronic kidney disease with stage 1 through stage 4 chronic kidney disease, or unspecified chronic kidney disease: Secondary | ICD-10-CM | POA: Insufficient documentation

## 2012-10-01 DIAGNOSIS — S43086A Other dislocation of unspecified shoulder joint, initial encounter: Secondary | ICD-10-CM | POA: Insufficient documentation

## 2012-10-01 DIAGNOSIS — W2209XA Striking against other stationary object, initial encounter: Secondary | ICD-10-CM | POA: Insufficient documentation

## 2012-10-01 DIAGNOSIS — Y92838 Other recreation area as the place of occurrence of the external cause: Secondary | ICD-10-CM | POA: Insufficient documentation

## 2012-10-01 DIAGNOSIS — Z9889 Other specified postprocedural states: Secondary | ICD-10-CM | POA: Insufficient documentation

## 2012-10-01 DIAGNOSIS — M948X9 Other specified disorders of cartilage, unspecified sites: Secondary | ICD-10-CM | POA: Insufficient documentation

## 2012-10-01 DIAGNOSIS — D631 Anemia in chronic kidney disease: Secondary | ICD-10-CM | POA: Insufficient documentation

## 2012-10-01 DIAGNOSIS — N189 Chronic kidney disease, unspecified: Secondary | ICD-10-CM | POA: Insufficient documentation

## 2012-10-01 DIAGNOSIS — Y9239 Other specified sports and athletic area as the place of occurrence of the external cause: Secondary | ICD-10-CM | POA: Insufficient documentation

## 2012-10-01 DIAGNOSIS — Z79899 Other long term (current) drug therapy: Secondary | ICD-10-CM | POA: Insufficient documentation

## 2012-10-01 MED ORDER — SODIUM CHLORIDE 0.9 % IV SOLN
Freq: Once | INTRAVENOUS | Status: AC
  Administered 2012-10-01: 21:00:00 via INTRAVENOUS

## 2012-10-01 MED ORDER — FENTANYL CITRATE 0.05 MG/ML IJ SOLN
50.0000 ug | Freq: Once | INTRAMUSCULAR | Status: AC
  Administered 2012-10-01: 50 ug via INTRAVENOUS
  Filled 2012-10-01: qty 2

## 2012-10-01 MED ORDER — PROPOFOL 10 MG/ML IV BOLUS
45.0000 mg | Freq: Once | INTRAVENOUS | Status: AC
  Administered 2012-10-01: 25 mg via INTRAVENOUS
  Filled 2012-10-01: qty 20

## 2012-10-01 NOTE — ED Notes (Signed)
PT. REPORTS INJURY TO LEFT SHOULDER WITH PAIN THIS EVENING WHILE WORKING OUT IN THE GYM . HEMODIALYSIS Q MON/WED/FRI.

## 2012-10-01 NOTE — ED Notes (Signed)
Pt ambulated in hall with slow, steady gait. Denies dizziness or nausea. No acute distress noted.

## 2012-10-01 NOTE — ED Provider Notes (Addendum)
History    CSN: [id number] Arrival date & time 10/01/12  2016  First MD Initiated Contact with Patient 10/01/12 2033     Chief Complaint  Patient presents with  . Shoulder Injury   (Consider location/radiation/quality/duration/timing/severity/associated sxs/prior Treatment) HPI Comments: Patient was at the gym punching a bag when his L eft shoulder dislocated, Has had nuumerous similar events and 20 years ago corrective surgery for same   Patient is a 37 y.o. male presenting with shoulder injury. The history is provided by the patient.  Shoulder Injury This is a new problem. The current episode started today. The problem occurs constantly. The problem has been unchanged. Associated symptoms include joint swelling. Pertinent negatives include no chills or numbness. The symptoms are aggravated by exertion. He has tried nothing for the symptoms. The treatment provided no relief.   Past Medical History  Diagnosis Date  . Hypertension   . Chronic kidney disease   . Metabolic bone disease   . Anemia in chronic kidney disease(285.21)    Past Surgical History  Procedure Laterality Date  . Shoulder surgery      20 years ago  . Av fistula placement  08/01/2011    Procedure: ARTERIOVENOUS (AV) FISTULA CREATION;  Surgeon: Elam Dutch, MD;  Location: [place] [facility];  Service: Vascular;  Laterality: Left;  . Insertion of dialysis catheter  08/01/2011    Procedure: INSERTION OF DIALYSIS CATHETER;  Surgeon: Elam Dutch, MD;  Location: [facility] OR;  Service: Vascular;  Laterality: Right;  Right Internal Jugular with 23 cm Dialysis Catheter   Family History  Problem Relation Age of Onset  . Hypertension Mother   . Diabetes Mother   . Hypertension Father    History  Substance Use Topics  . Smoking status: Never Smoker   . Smokeless tobacco: Never Used  . Alcohol Use: No    Review of Systems  Constitutional: Negative for chills.  Musculoskeletal: Positive for joint swelling.  Neurological:  Negative for dizziness and numbness.  All other systems reviewed and are negative.    Allergies  Verapamil  Home Medications   Current Outpatient Rx  Name  Route  Sig  Dispense  Refill  . Calcium Carbonate-Vitamin D (CALCIUM 600+D PO)   Oral   Take 1 tablet by mouth daily.          Marland Kitchen CLONIDINE HCL PO   Oral   Take by mouth daily.         . Iron TABS   Oral   Take 1 tablet by mouth daily.          Marland Kitchen losartan (COZAAR) 50 MG tablet   Oral   Take 50 mg by mouth daily.          BP 165/95  Pulse 73  Temp(Src) 97.9 F (36.6 C) (Oral)  Resp 21  Ht 5\' 9"  (1.753 m)  Wt 192 lb (87.091 kg)  BMI 28.34 kg/m2  SpO2 100% Physical Exam  Nursing note and vitals reviewed. Constitutional: He is oriented to person, place, and time. He appears well-developed and well-nourished.  HENT:  Head: Normocephalic.  Eyes: Pupils are equal, round, and reactive to light.  Neck: Normal range of motion.  Cardiovascular: Normal rate and regular rhythm.   Pulmonary/Chest: Effort normal and breath sounds normal.  Musculoskeletal: He exhibits tenderness. He exhibits no edema.       Left shoulder: He exhibits decreased range of motion, deformity and pain.  gortex graft Left upper arm for dialysis  +  thrill pre and post reduction   Neurological: He is alert and oriented to person, place, and time.  Skin: Skin is warm.    ED Course  Reduction of dislocation Date/Time: 10/12/2012 7:55 PM Performed by: Garald Balding Authorized by: Garald Balding Comments: Preformed by Dr. Waverly Ferrari   (including critical care time) Labs Reviewed - No data to display Dg Shoulder Left  10/01/2012   *RADIOLOGY REPORT*  Clinical Data: Lifting injury.  Shoulder pain.  LEFT SHOULDER - 2+ VIEW  Comparison: None.  Findings: Anterior inferior dislocation of the humeral head observed.  No definite Hill-Sachs impaction.  I do not see a bony Bankart fragment.  IMPRESSION:  1.  Anterior inferior humeral head dislocation.    Original Report Authenticated By: Van Clines, M.D.   Dg Shoulder Left Port  10/01/2012   *RADIOLOGY REPORT*  Clinical Data: Post reduction films.  PORTABLE LEFT SHOULDER - 2+ VIEW  Comparison:  Radiograph from earlier the same day  Findings: Interval relocation of the left glenohumeral joint. Probable Hill-Sachs deformity.  No definite Bankart lesion.  The acromioclavicular joint is located.  IMPRESSION:  Located left glenohumeral joint.  Probable Hill-Sachs deformity.   Original Report Authenticated By: Jorje Guild   1. Shoulder dislocation, left, initial encounter     MDM   Post reduction xray shows successful reduction Pulses strong in radial as well as gortex graft   Garald Balding, NP 10/01/12 2230  Garald Balding, NP 10/12/12 1955

## 2012-10-01 NOTE — ED Notes (Signed)
BP cuff removed at 2145 due to administration of propofol.

## 2012-10-01 NOTE — Discharge Instructions (Signed)
Shoulder Dislocation  Shoulder dislocation is when your upper arm bone (humerus) is forced out of your shoulder joint. Your doctor will put your shoulder back into the joint by pulling on your arm or through surgery. Your arm will be placed in a shoulder immobilizer or sling. The shoulder immobilizer or sling holds your shoulder in place while it heals. HOME CARE   Rest your injured joint. Do not move it until instructed to do so.  Put ice on your injured joint as told by your doctor.  Put ice in a plastic bag.  Place a towel between your skin and the bag.  Leave the ice on for 15-20 minutes at a time, every 2 hours while you are awake.  Only take medicines as told by your doctor.  Squeeze a ball to exercise your hand. GET HELP RIGHT AWAY IF:   Your splint or sling becomes damaged.  Your pain becomes worse, not better.  You lose feeling in your arm or hand.  Your arm or hand becomes white or cold. MAKE SURE YOU:   Understand these instructions.  Will watch your condition.  Will get help right away if you are not doing well or get worse. Document Released: 05/29/2011 Document Reviewed: 05/29/2011 [facility] Patient Information 2014 [place]. Make an appointment with DR. Chandler for follow up evaluation Wear the shoulder immobilizer for the next few days to allow the tendons to tighten up

## 2012-10-01 NOTE — Progress Notes (Signed)
Orthopedic Tech Progress Note Patient Details:  Charles [name] [birth year] [id number]  Patient ID: Charles [name], male   DOB: Feb 18, 1976, 37 y.o.   MRN: [id number] Viewed order from doctor's order list   [name] 10/01/2012, 9:56 PM

## 2012-10-01 NOTE — Progress Notes (Signed)
Orthopedic Tech Progress Note Patient Details:  Charles [name] [birth year] [id number]    Shoulder immobilizer  [name] 10/01/2012, 9:55 PM

## 2012-10-04 NOTE — ED Provider Notes (Signed)
Medical screening examination/treatment/procedure(s) were performed by non-physician practitioner and as supervising physician I was immediately available for consultation/collaboration.  Orpah Greek, MD 10/04/12 [phone]

## 2012-10-14 NOTE — ED Provider Notes (Signed)
Medical screening examination/treatment/procedure(s) were performed by non-physician practitioner and as supervising physician I was immediately available for consultation/collaboration.   Orpah Greek, MD 10/14/12 1501

## 2013-07-25 DIAGNOSIS — I120 Hypertensive chronic kidney disease with stage 5 chronic kidney disease or end stage renal disease: Secondary | ICD-10-CM | POA: Diagnosis present

## 2013-07-25 DIAGNOSIS — Z992 Dependence on renal dialysis: Secondary | ICD-10-CM | POA: Diagnosis not present

## 2013-07-25 DIAGNOSIS — E871 Hypo-osmolality and hyponatremia: Secondary | ICD-10-CM | POA: Diagnosis not present

## 2013-07-25 DIAGNOSIS — D539 Nutritional anemia, unspecified: Secondary | ICD-10-CM | POA: Diagnosis present

## 2013-07-25 DIAGNOSIS — N039 Chronic nephritic syndrome with unspecified morphologic changes: Secondary | ICD-10-CM | POA: Diagnosis present

## 2013-07-25 DIAGNOSIS — Z781 Physical restraint status: Secondary | ICD-10-CM | POA: Diagnosis not present

## 2013-07-25 DIAGNOSIS — E872 Acidosis, unspecified: Secondary | ICD-10-CM | POA: Diagnosis not present

## 2013-07-25 DIAGNOSIS — D631 Anemia in chronic kidney disease: Secondary | ICD-10-CM | POA: Diagnosis present

## 2013-07-25 DIAGNOSIS — E875 Hyperkalemia: Secondary | ICD-10-CM | POA: Diagnosis not present

## 2013-07-25 DIAGNOSIS — D62 Acute posthemorrhagic anemia: Secondary | ICD-10-CM | POA: Diagnosis not present

## 2013-07-25 DIAGNOSIS — N2581 Secondary hyperparathyroidism of renal origin: Secondary | ICD-10-CM | POA: Diagnosis present

## 2013-07-25 DIAGNOSIS — N186 End stage renal disease: Secondary | ICD-10-CM | POA: Diagnosis present

## 2013-08-05 ENCOUNTER — Emergency Department (HOSPITAL_COMMUNITY): Payer: Medicare Other

## 2013-08-05 ENCOUNTER — Emergency Department (HOSPITAL_COMMUNITY)
Admission: EM | Admit: 2013-08-05 | Discharge: 2013-08-05 | Disposition: A | Discharge status: Other Acute Inpatient Hospital | Payer: Medicare Other | Attending: Emergency Medicine | Admitting: Emergency Medicine

## 2013-08-05 ENCOUNTER — Encounter (HOSPITAL_COMMUNITY): Payer: Self-pay | Admitting: Emergency Medicine

## 2013-08-05 DIAGNOSIS — Z992 Dependence on renal dialysis: Secondary | ICD-10-CM | POA: Diagnosis not present

## 2013-08-05 DIAGNOSIS — Z79899 Other long term (current) drug therapy: Secondary | ICD-10-CM | POA: Insufficient documentation

## 2013-08-05 DIAGNOSIS — T8610 Unspecified complication of kidney transplant: Secondary | ICD-10-CM | POA: Diagnosis not present

## 2013-08-05 DIAGNOSIS — C7952 Secondary malignant neoplasm of bone marrow: Secondary | ICD-10-CM | POA: Diagnosis not present

## 2013-08-05 DIAGNOSIS — Z940 Kidney transplant status: Secondary | ICD-10-CM

## 2013-08-05 DIAGNOSIS — M545 Low back pain, unspecified: Secondary | ICD-10-CM | POA: Insufficient documentation

## 2013-08-05 DIAGNOSIS — R109 Unspecified abdominal pain: Secondary | ICD-10-CM | POA: Diagnosis not present

## 2013-08-05 DIAGNOSIS — N186 End stage renal disease: Secondary | ICD-10-CM | POA: Insufficient documentation

## 2013-08-05 DIAGNOSIS — N039 Chronic nephritic syndrome with unspecified morphologic changes: Secondary | ICD-10-CM

## 2013-08-05 DIAGNOSIS — I120 Hypertensive chronic kidney disease with stage 5 chronic kidney disease or end stage renal disease: Secondary | ICD-10-CM | POA: Insufficient documentation

## 2013-08-05 DIAGNOSIS — D631 Anemia in chronic kidney disease: Secondary | ICD-10-CM | POA: Insufficient documentation

## 2013-08-05 DIAGNOSIS — C7951 Secondary malignant neoplasm of bone: Secondary | ICD-10-CM | POA: Diagnosis not present

## 2013-08-05 DIAGNOSIS — M549 Dorsalgia, unspecified: Secondary | ICD-10-CM

## 2013-08-05 LAB — CBC WITH DIFFERENTIAL/PLATELET
Basophils Absolute: 0 10*3/uL (ref 0.0–0.1)
Basophils Relative: 0 % (ref 0–1)
Eosinophils Absolute: 0 10*3/uL (ref 0.0–0.7)
Eosinophils Relative: 0 % (ref 0–5)
HEMATOCRIT: 25.4 % — AB (ref 39.0–52.0)
HEMOGLOBIN: 8.9 g/dL — AB (ref 13.0–17.0)
LYMPHS PCT: 7 % — AB (ref 12–46)
Lymphs Abs: 0.9 10*3/uL (ref 0.7–4.0)
MCH: 31.6 pg (ref 26.0–34.0)
MCHC: 35 g/dL (ref 30.0–36.0)
MCV: 90.1 fL (ref 78.0–100.0)
MONO ABS: 0.2 10*3/uL (ref 0.1–1.0)
MONOS PCT: 2 % — AB (ref 3–12)
NEUTROS ABS: 11.2 10*3/uL — AB (ref 1.7–7.7)
Neutrophils Relative %: 91 % — ABNORMAL HIGH (ref 43–77)
Platelets: 247 10*3/uL (ref 150–400)
RBC: 2.82 MIL/uL — ABNORMAL LOW (ref 4.22–5.81)
RDW: 13.1 % (ref 11.5–15.5)
WBC: 12.3 10*3/uL — AB (ref 4.0–10.5)

## 2013-08-05 LAB — COMPREHENSIVE METABOLIC PANEL
ALT: 25 U/L (ref 0–53)
AST: 22 U/L (ref 0–37)
Albumin: 3.7 g/dL (ref 3.5–5.2)
Alkaline Phosphatase: 63 U/L (ref 39–117)
BILIRUBIN TOTAL: 0.4 mg/dL (ref 0.3–1.2)
BUN: 99 mg/dL — AB (ref 6–23)
CHLORIDE: 103 meq/L (ref 96–112)
CO2: 18 meq/L — AB (ref 19–32)
CREATININE: 7.87 mg/dL — AB (ref 0.50–1.35)
Calcium: 9.4 mg/dL (ref 8.4–10.5)
GFR calc Af Amer: 9 mL/min — ABNORMAL LOW (ref >90)
GFR, EST NON AFRICAN AMERICAN: 8 mL/min — AB (ref >90)
Glucose, Bld: 107 mg/dL — ABNORMAL HIGH (ref 70–99)
Potassium: 4.3 mEq/L (ref 3.7–5.3)
Sodium: 141 mEq/L (ref 137–147)
Total Protein: 7.1 g/dL (ref 6.0–8.3)

## 2013-08-05 LAB — LIPASE, BLOOD: LIPASE: 74 U/L — AB (ref 11–59)

## 2013-08-05 LAB — I-STAT CG4 LACTIC ACID, ED: LACTIC ACID, VENOUS: 0.77 mmol/L (ref 0.5–2.2)

## 2013-08-05 MED ORDER — HYDROMORPHONE HCL PF 1 MG/ML IJ SOLN
1.0000 mg | Freq: Once | INTRAMUSCULAR | Status: AC
  Administered 2013-08-05: 1 mg via INTRAVENOUS
  Filled 2013-08-05: qty 1

## 2013-08-05 MED ORDER — SODIUM CHLORIDE 0.9 % IV SOLN
Freq: Once | INTRAVENOUS | Status: AC
  Administered 2013-08-05: 05:00:00 via INTRAVENOUS

## 2013-08-05 MED ORDER — LABETALOL HCL 5 MG/ML IV SOLN
10.0000 mg | Freq: Once | INTRAVENOUS | Status: AC
  Administered 2013-08-05: 10 mg via INTRAVENOUS
  Filled 2013-08-05: qty 4

## 2013-08-05 MED ORDER — ONDANSETRON HCL 4 MG/2ML IJ SOLN
4.0000 mg | Freq: Once | INTRAMUSCULAR | Status: AC
  Administered 2013-08-05: 4 mg via INTRAVENOUS
  Filled 2013-08-05: qty 2

## 2013-08-05 MED ORDER — HYDROMORPHONE HCL PF 1 MG/ML IJ SOLN
1.0000 mg | INTRAMUSCULAR | Status: DC | PRN
  Administered 2013-08-05: 1 mg via INTRAVENOUS
  Filled 2013-08-05: qty 1

## 2013-08-05 NOTE — ED Notes (Signed)
Patient presents to ED via POV. Patient states that he has been experiencing right lower back pain since 0030 this am. Pt states that within the last hour he has started to experience right lower abdominal pain. Denies any n/v/d. Patient had kidney transplant surgery 10 days (May 8th, 2015) at [facility]. Patient is pacing around room at this time. A&Ox4.

## 2013-08-05 NOTE — ED Provider Notes (Signed)
8:21 AM Discussed with radiology. Nonocclusive renal vein thrombosis. Pt/family updated.   8:46 AM Discussed with pt's surgeon. Will transfer to Ruby Cola, MD 08/08/13 [phone]

## 2013-08-05 NOTE — ED Notes (Signed)
Patient has taken his scheduled medications with the exception asa, lasix, and hydrocodone with Dr. America Brown permission.

## 2013-08-05 NOTE — ED Provider Notes (Signed)
CSN: [id number]     Arrival date & time 08/05/13  0409 History   First MD Initiated Contact with Patient 08/05/13 [phone]     Chief Complaint  Patient presents with  . Back Pain  . Abdominal Pain     (Consider location/radiation/quality/duration/timing/severity/associated sxs/prior Treatment) The history is provided by the patient.   38 year old male is 10 days post renal transplant and was doing well until 00 30. He got up to go the bathroom and when he returned to bed, had sudden onset of severe pain in his lower back with radiation around to the right mid and upper abdomen. Pain is sharp and severe and he rates it 10/10. He cannot find a position that is comfortable. Nothing makes pain better nothing makes it worse. There is no associated nausea or vomiting. There is no constipation or diarrhea. He denies any dysuria. He took 3 doses of hydrocodone with no relief of pain.  Past Medical History  Diagnosis Date  . Hypertension   . Chronic kidney disease   . Metabolic bone disease   . Anemia in chronic kidney disease(285.21)    Past Surgical History  Procedure Laterality Date  . Shoulder surgery      20 years ago  . Av fistula placement  08/01/2011    Procedure: ARTERIOVENOUS (AV) FISTULA CREATION;  Surgeon: Elam Dutch, MD;  Location: [place];  Service: Vascular;  Laterality: Left;  . Insertion of dialysis catheter  08/01/2011    Procedure: INSERTION OF DIALYSIS CATHETER;  Surgeon: Elam Dutch, MD;  Location: [facility] OR;  Service: Vascular;  Laterality: Right;  Right Internal Jugular with 23 cm Dialysis Catheter   Family History  Problem Relation Age of Onset  . Hypertension Mother   . Diabetes Mother   . Hypertension Father    History  Substance Use Topics  . Smoking status: Never Smoker   . Smokeless tobacco: Never Used  . Alcohol Use: No    Review of Systems  All other systems reviewed and are negative.     Allergies  Verapamil  Home Medications   Prior to  Admission medications   Medication Sig Start Date End Date Taking? Authorizing Provider  Calcium Carbonate-Vitamin D (CALCIUM 600+D PO) Take 1 tablet by mouth daily.     Historical Provider, MD  CLONIDINE HCL PO Take by mouth daily.    Historical Provider, MD  Iron TABS Take 1 tablet by mouth daily.     Historical Provider, MD  losartan (COZAAR) 50 MG tablet Take 50 mg by mouth daily. 08/18/11   Historical Provider, MD   BP 149/74  Pulse 100  Temp(Src) 97.5 F (36.4 C) (Oral)  Resp 23  Ht 5\' 9"  (1.753 m)  Wt 200 lb (90.719 kg)  BMI 29.52 kg/m2  SpO2 100% Physical Exam  Nursing note and vitals reviewed.  38 year old male, who is pacing the floor and obviously uncomfortable, but is in no acute distress. Vital signs are significant for hypertension with blood pressure 149/74, and tachypnea with respiratory rate of 23. Oxygen saturation is 100%, which is normal. Head is normocephalic and atraumatic. PERRLA, EOMI. Oropharynx is clear. Neck is nontender and supple without adenopathy or JVD. Back is nontender and there is no CVA tenderness. Lungs are clear without rales, wheezes, or rhonchi. Chest is nontender. Heart has regular rate and rhythm without murmur. Abdomen is soft, flat, with surgical scar in the right lower quadrant with staples in place. Wound is clean without evidence  of infection. Transplanted kidney is palpable and tender in the right lower abdomen. Remainder of abdomen is soft and nontender. Peristalsis is decreased. There are no masses or hepatosplenomegaly. Extremities have no cyanosis or edema, full range of motion is present. Skin is warm and dry without rash. Neurologic: Mental status is normal, cranial nerves are intact, there are no motor or sensory deficits.  ED Course  Procedures (including critical care time) Labs Review Results for orders placed during the hospital encounter of 08/05/13  CBC WITH DIFFERENTIAL      Result Value Ref Range   WBC 12.3 (*) 4.0 -  10.5 K/uL   RBC 2.82 (*) 4.22 - 5.81 MIL/uL   Hemoglobin 8.9 (*) 13.0 - 17.0 g/dL   HCT 25.4 (*) 39.0 - 52.0 %   MCV 90.1  78.0 - 100.0 fL   MCH 31.6  26.0 - 34.0 pg   MCHC 35.0  30.0 - 36.0 g/dL   RDW 13.1  11.5 - 15.5 %   Platelets 247  150 - 400 K/uL   Neutrophils Relative % 91 (*) 43 - 77 %   Neutro Abs 11.2 (*) 1.7 - 7.7 K/uL   Lymphocytes Relative 7 (*) 12 - 46 %   Lymphs Abs 0.9  0.7 - 4.0 K/uL   Monocytes Relative 2 (*) 3 - 12 %   Monocytes Absolute 0.2  0.1 - 1.0 K/uL   Eosinophils Relative 0  0 - 5 %   Eosinophils Absolute 0.0  0.0 - 0.7 K/uL   Basophils Relative 0  0 - 1 %   Basophils Absolute 0.0  0.0 - 0.1 K/uL  COMPREHENSIVE METABOLIC PANEL      Result Value Ref Range   Sodium 141  137 - 147 mEq/L   Potassium 4.3  3.7 - 5.3 mEq/L   Chloride 103  96 - 112 mEq/L   CO2 18 (*) 19 - 32 mEq/L   Glucose, Bld 107 (*) 70 - 99 mg/dL   BUN 99 (*) 6 - 23 mg/dL   Creatinine, Ser 7.87 (*) 0.50 - 1.35 mg/dL   Calcium 9.4  8.4 - 10.5 mg/dL   Total Protein 7.1  6.0 - 8.3 g/dL   Albumin 3.7  3.5 - 5.2 g/dL   AST 22  0 - 37 U/L   ALT 25  0 - 53 U/L   Alkaline Phosphatase 63  39 - 117 U/L   Total Bilirubin 0.4  0.3 - 1.2 mg/dL   GFR calc non Af Amer 8 (*) >90 mL/min   GFR calc Af Amer 9 (*) >90 mL/min  LIPASE, BLOOD      Result Value Ref Range   Lipase 74 (*) 11 - 59 U/L  I-STAT CG4 LACTIC ACID, ED      Result Value Ref Range   Lactic Acid, Venous 0.77  0.5 - 2.2 mmol/L   CRITICAL CARE Performed by: Delora Fuel Total critical care time: 40 minutes Critical care time was exclusive of separately billable procedures and treating other patients. Critical care was necessary to treat or prevent imminent or life-threatening deterioration. Critical care was time spent personally by me on the following activities: development of treatment plan with patient and/or surrogate as well as nursing, discussions with consultants, evaluation of patient's response to treatment, examination of  patient, obtaining history from patient or surrogate, ordering and performing treatments and interventions, ordering and review of laboratory studies, ordering and review of radiographic studies, pulse oximetry and re-evaluation of patient's condition.  MDM   Final diagnoses:  Back pain  History of renal transplant    Back and abdominal pain in a pattern consistent with renal colic. Tenderness of the transplanted kidney is worrisome for possible vascular compromise. Transplant renal ultrasound is ordered and pending at this time. He was given hydromorphone for pain which gave him temporary relief and did need to be repeated. Initial laboratory workup shows normal lactic acid, mild elevation of lipase which is probably related to renal insufficiency, and the persistently high BUN and creatinine. Also noted is anemia consistent with end-stage renal disease. Case is signed out to Dr. Wilson Singer to evaluate results of renal transplant ultrasound. I anticipate he'll need to be transferred to [facility] where his transplant was done.    Delora Fuel, MD [phone]

## 2013-08-05 NOTE — ED Notes (Signed)
EDP aware of pts continued hypertension in spite of the pt taking his home BP medications. EDP ordered IV BP medication. Will continue to monitor pts BP. Pt denies any HA, dizziness, visual changes, or CP. Pt A&Ox4, resp e/u, and skin warm and dry. He is complaining of 5/10 pain again.

## 2013-08-06 DIAGNOSIS — Z5181 Encounter for therapeutic drug level monitoring: Secondary | ICD-10-CM | POA: Diagnosis not present

## 2013-08-06 DIAGNOSIS — Z940 Kidney transplant status: Secondary | ICD-10-CM | POA: Diagnosis not present

## 2013-08-06 DIAGNOSIS — Z48298 Encounter for aftercare following other organ transplant: Secondary | ICD-10-CM | POA: Diagnosis not present

## 2013-08-06 DIAGNOSIS — Z79899 Other long term (current) drug therapy: Secondary | ICD-10-CM | POA: Diagnosis not present

## 2013-08-18 DIAGNOSIS — Z940 Kidney transplant status: Secondary | ICD-10-CM | POA: Diagnosis not present

## 2013-08-18 DIAGNOSIS — I10 Essential (primary) hypertension: Secondary | ICD-10-CM | POA: Diagnosis not present

## 2013-08-18 DIAGNOSIS — D899 Disorder involving the immune mechanism, unspecified: Secondary | ICD-10-CM | POA: Diagnosis not present

## 2013-08-21 DIAGNOSIS — N186 End stage renal disease: Secondary | ICD-10-CM | POA: Diagnosis not present

## 2013-08-21 DIAGNOSIS — M948X9 Other specified disorders of cartilage, unspecified sites: Secondary | ICD-10-CM | POA: Diagnosis not present

## 2013-08-21 DIAGNOSIS — D899 Disorder involving the immune mechanism, unspecified: Secondary | ICD-10-CM | POA: Diagnosis not present

## 2013-08-21 DIAGNOSIS — Z940 Kidney transplant status: Secondary | ICD-10-CM | POA: Diagnosis not present

## 2013-08-21 DIAGNOSIS — I150 Renovascular hypertension: Secondary | ICD-10-CM | POA: Diagnosis not present

## 2013-08-21 DIAGNOSIS — N2581 Secondary hyperparathyroidism of renal origin: Secondary | ICD-10-CM | POA: Diagnosis not present

## 2013-09-08 DIAGNOSIS — Z940 Kidney transplant status: Secondary | ICD-10-CM | POA: Diagnosis not present

## 2013-09-08 DIAGNOSIS — D899 Disorder involving the immune mechanism, unspecified: Secondary | ICD-10-CM | POA: Diagnosis not present

## 2013-09-15 DIAGNOSIS — D899 Disorder involving the immune mechanism, unspecified: Secondary | ICD-10-CM | POA: Diagnosis not present

## 2013-09-15 DIAGNOSIS — Z940 Kidney transplant status: Secondary | ICD-10-CM | POA: Diagnosis not present

## 2013-09-15 DIAGNOSIS — I10 Essential (primary) hypertension: Secondary | ICD-10-CM | POA: Diagnosis not present

## 2013-09-22 DIAGNOSIS — I10 Essential (primary) hypertension: Secondary | ICD-10-CM | POA: Diagnosis not present

## 2013-09-22 DIAGNOSIS — Z940 Kidney transplant status: Secondary | ICD-10-CM | POA: Diagnosis not present

## 2013-09-22 DIAGNOSIS — D899 Disorder involving the immune mechanism, unspecified: Secondary | ICD-10-CM | POA: Diagnosis not present

## 2013-10-06 DIAGNOSIS — I10 Essential (primary) hypertension: Secondary | ICD-10-CM | POA: Diagnosis not present

## 2013-10-06 DIAGNOSIS — Z940 Kidney transplant status: Secondary | ICD-10-CM | POA: Diagnosis not present

## 2013-10-06 DIAGNOSIS — D899 Disorder involving the immune mechanism, unspecified: Secondary | ICD-10-CM | POA: Diagnosis not present

## 2013-10-20 DIAGNOSIS — D899 Disorder involving the immune mechanism, unspecified: Secondary | ICD-10-CM | POA: Diagnosis not present

## 2013-10-20 DIAGNOSIS — Z940 Kidney transplant status: Secondary | ICD-10-CM | POA: Diagnosis not present

## 2013-11-18 DIAGNOSIS — M948X9 Other specified disorders of cartilage, unspecified sites: Secondary | ICD-10-CM | POA: Diagnosis not present

## 2013-11-18 DIAGNOSIS — N2581 Secondary hyperparathyroidism of renal origin: Secondary | ICD-10-CM | POA: Diagnosis not present

## 2013-11-18 DIAGNOSIS — Z940 Kidney transplant status: Secondary | ICD-10-CM | POA: Diagnosis not present

## 2013-11-18 DIAGNOSIS — Z01818 Encounter for other preprocedural examination: Secondary | ICD-10-CM | POA: Diagnosis not present

## 2013-11-18 DIAGNOSIS — I120 Hypertensive chronic kidney disease with stage 5 chronic kidney disease or end stage renal disease: Secondary | ICD-10-CM | POA: Diagnosis not present

## 2013-11-18 DIAGNOSIS — D899 Disorder involving the immune mechanism, unspecified: Secondary | ICD-10-CM | POA: Diagnosis not present

## 2013-12-02 DIAGNOSIS — Z940 Kidney transplant status: Secondary | ICD-10-CM | POA: Diagnosis not present

## 2013-12-02 DIAGNOSIS — Z79899 Other long term (current) drug therapy: Secondary | ICD-10-CM | POA: Diagnosis not present

## 2013-12-02 DIAGNOSIS — I10 Essential (primary) hypertension: Secondary | ICD-10-CM | POA: Diagnosis not present

## 2013-12-02 DIAGNOSIS — D649 Anemia, unspecified: Secondary | ICD-10-CM | POA: Diagnosis not present

## 2013-12-30 DIAGNOSIS — N186 End stage renal disease: Secondary | ICD-10-CM | POA: Diagnosis not present

## 2013-12-30 DIAGNOSIS — D649 Anemia, unspecified: Secondary | ICD-10-CM | POA: Diagnosis not present

## 2013-12-30 DIAGNOSIS — Z23 Encounter for immunization: Secondary | ICD-10-CM | POA: Diagnosis not present

## 2013-12-30 DIAGNOSIS — I10 Essential (primary) hypertension: Secondary | ICD-10-CM | POA: Diagnosis not present

## 2013-12-30 DIAGNOSIS — Z940 Kidney transplant status: Secondary | ICD-10-CM | POA: Diagnosis not present

## 2013-12-30 DIAGNOSIS — Z79899 Other long term (current) drug therapy: Secondary | ICD-10-CM | POA: Diagnosis not present

## 2013-12-30 DIAGNOSIS — I120 Hypertensive chronic kidney disease with stage 5 chronic kidney disease or end stage renal disease: Secondary | ICD-10-CM | POA: Diagnosis not present

## 2013-12-30 DIAGNOSIS — Z48298 Encounter for aftercare following other organ transplant: Secondary | ICD-10-CM | POA: Diagnosis not present

## 2014-02-03 DIAGNOSIS — D649 Anemia, unspecified: Secondary | ICD-10-CM | POA: Diagnosis not present

## 2014-02-03 DIAGNOSIS — Z7982 Long term (current) use of aspirin: Secondary | ICD-10-CM | POA: Diagnosis not present

## 2014-02-03 DIAGNOSIS — Z48298 Encounter for aftercare following other organ transplant: Secondary | ICD-10-CM | POA: Diagnosis not present

## 2014-02-03 DIAGNOSIS — Z79899 Other long term (current) drug therapy: Secondary | ICD-10-CM | POA: Diagnosis not present

## 2014-02-03 DIAGNOSIS — N186 End stage renal disease: Secondary | ICD-10-CM | POA: Diagnosis not present

## 2014-02-03 DIAGNOSIS — I10 Essential (primary) hypertension: Secondary | ICD-10-CM | POA: Diagnosis not present

## 2014-02-03 DIAGNOSIS — Z940 Kidney transplant status: Secondary | ICD-10-CM | POA: Diagnosis not present

## 2014-05-18 DIAGNOSIS — N2581 Secondary hyperparathyroidism of renal origin: Secondary | ICD-10-CM | POA: Diagnosis not present

## 2014-05-18 DIAGNOSIS — Z940 Kidney transplant status: Secondary | ICD-10-CM | POA: Diagnosis not present

## 2014-05-20 DIAGNOSIS — Z940 Kidney transplant status: Secondary | ICD-10-CM | POA: Diagnosis not present

## 2014-05-20 DIAGNOSIS — Z949 Transplanted organ and tissue status, unspecified: Secondary | ICD-10-CM | POA: Diagnosis not present

## 2014-05-20 DIAGNOSIS — I158 Other secondary hypertension: Secondary | ICD-10-CM | POA: Diagnosis not present

## 2014-05-20 DIAGNOSIS — N183 Chronic kidney disease, stage 3 (moderate): Secondary | ICD-10-CM | POA: Diagnosis not present

## 2014-09-30 DIAGNOSIS — Z940 Kidney transplant status: Secondary | ICD-10-CM | POA: Diagnosis not present

## 2014-09-30 DIAGNOSIS — N2581 Secondary hyperparathyroidism of renal origin: Secondary | ICD-10-CM | POA: Diagnosis not present

## 2014-10-13 DIAGNOSIS — Z862 Personal history of diseases of the blood and blood-forming organs and certain disorders involving the immune mechanism: Secondary | ICD-10-CM | POA: Diagnosis not present

## 2014-10-13 DIAGNOSIS — Z940 Kidney transplant status: Secondary | ICD-10-CM | POA: Diagnosis not present

## 2014-10-13 DIAGNOSIS — N2581 Secondary hyperparathyroidism of renal origin: Secondary | ICD-10-CM | POA: Diagnosis not present

## 2014-10-13 DIAGNOSIS — N183 Chronic kidney disease, stage 3 (moderate): Secondary | ICD-10-CM | POA: Diagnosis not present

## 2014-10-13 DIAGNOSIS — I158 Other secondary hypertension: Secondary | ICD-10-CM | POA: Diagnosis not present

## 2014-10-13 DIAGNOSIS — Z949 Transplanted organ and tissue status, unspecified: Secondary | ICD-10-CM | POA: Diagnosis not present

## 2014-11-02 DIAGNOSIS — N183 Chronic kidney disease, stage 3 (moderate): Secondary | ICD-10-CM | POA: Diagnosis not present

## 2015-03-26 DIAGNOSIS — N2581 Secondary hyperparathyroidism of renal origin: Secondary | ICD-10-CM | POA: Diagnosis not present

## 2015-03-26 DIAGNOSIS — Z862 Personal history of diseases of the blood and blood-forming organs and certain disorders involving the immune mechanism: Secondary | ICD-10-CM | POA: Diagnosis not present

## 2015-03-26 DIAGNOSIS — Z23 Encounter for immunization: Secondary | ICD-10-CM | POA: Diagnosis not present

## 2015-03-26 DIAGNOSIS — N183 Chronic kidney disease, stage 3 (moderate): Secondary | ICD-10-CM | POA: Diagnosis not present

## 2015-03-26 DIAGNOSIS — Z940 Kidney transplant status: Secondary | ICD-10-CM | POA: Diagnosis not present

## 2015-07-26 DIAGNOSIS — N2581 Secondary hyperparathyroidism of renal origin: Secondary | ICD-10-CM | POA: Diagnosis not present

## 2015-07-26 DIAGNOSIS — Z940 Kidney transplant status: Secondary | ICD-10-CM | POA: Diagnosis not present

## 2015-07-26 DIAGNOSIS — E559 Vitamin D deficiency, unspecified: Secondary | ICD-10-CM | POA: Diagnosis not present

## 2015-07-27 DIAGNOSIS — Z940 Kidney transplant status: Secondary | ICD-10-CM | POA: Diagnosis not present

## 2015-07-27 DIAGNOSIS — Z949 Transplanted organ and tissue status, unspecified: Secondary | ICD-10-CM | POA: Diagnosis not present

## 2015-07-27 DIAGNOSIS — N2581 Secondary hyperparathyroidism of renal origin: Secondary | ICD-10-CM | POA: Diagnosis not present

## 2015-07-27 DIAGNOSIS — Z862 Personal history of diseases of the blood and blood-forming organs and certain disorders involving the immune mechanism: Secondary | ICD-10-CM | POA: Diagnosis not present

## 2015-07-27 DIAGNOSIS — I158 Other secondary hypertension: Secondary | ICD-10-CM | POA: Diagnosis not present

## 2015-07-27 DIAGNOSIS — N183 Chronic kidney disease, stage 3 (moderate): Secondary | ICD-10-CM | POA: Diagnosis not present

## 2015-07-28 DIAGNOSIS — E785 Hyperlipidemia, unspecified: Secondary | ICD-10-CM | POA: Diagnosis not present

## 2015-07-28 DIAGNOSIS — Z5181 Encounter for therapeutic drug level monitoring: Secondary | ICD-10-CM | POA: Diagnosis not present

## 2015-07-28 DIAGNOSIS — D899 Disorder involving the immune mechanism, unspecified: Secondary | ICD-10-CM | POA: Diagnosis not present

## 2015-07-28 DIAGNOSIS — D631 Anemia in chronic kidney disease: Secondary | ICD-10-CM | POA: Diagnosis not present

## 2015-07-28 DIAGNOSIS — N186 End stage renal disease: Secondary | ICD-10-CM | POA: Diagnosis not present

## 2015-07-28 DIAGNOSIS — Z940 Kidney transplant status: Secondary | ICD-10-CM | POA: Diagnosis not present

## 2015-07-28 DIAGNOSIS — Z4822 Encounter for aftercare following kidney transplant: Secondary | ICD-10-CM | POA: Diagnosis not present

## 2015-07-28 DIAGNOSIS — Z949 Transplanted organ and tissue status, unspecified: Secondary | ICD-10-CM | POA: Diagnosis not present

## 2015-07-28 DIAGNOSIS — Z79899 Other long term (current) drug therapy: Secondary | ICD-10-CM | POA: Diagnosis not present

## 2015-07-28 DIAGNOSIS — Z48298 Encounter for aftercare following other organ transplant: Secondary | ICD-10-CM | POA: Diagnosis not present

## 2015-07-28 DIAGNOSIS — I158 Other secondary hypertension: Secondary | ICD-10-CM | POA: Diagnosis not present

## 2015-12-09 DIAGNOSIS — E559 Vitamin D deficiency, unspecified: Secondary | ICD-10-CM | POA: Diagnosis not present

## 2015-12-09 DIAGNOSIS — Z940 Kidney transplant status: Secondary | ICD-10-CM | POA: Diagnosis not present

## 2015-12-09 DIAGNOSIS — N2581 Secondary hyperparathyroidism of renal origin: Secondary | ICD-10-CM | POA: Diagnosis not present

## 2015-12-14 DIAGNOSIS — Z23 Encounter for immunization: Secondary | ICD-10-CM | POA: Diagnosis not present

## 2015-12-14 DIAGNOSIS — Z862 Personal history of diseases of the blood and blood-forming organs and certain disorders involving the immune mechanism: Secondary | ICD-10-CM | POA: Diagnosis not present

## 2015-12-14 DIAGNOSIS — N183 Chronic kidney disease, stage 3 (moderate): Secondary | ICD-10-CM | POA: Diagnosis not present

## 2015-12-14 DIAGNOSIS — N2581 Secondary hyperparathyroidism of renal origin: Secondary | ICD-10-CM | POA: Diagnosis not present

## 2015-12-14 DIAGNOSIS — E669 Obesity, unspecified: Secondary | ICD-10-CM | POA: Diagnosis not present

## 2015-12-14 DIAGNOSIS — Z940 Kidney transplant status: Secondary | ICD-10-CM | POA: Diagnosis not present

## 2015-12-27 IMAGING — US US RENAL TRANSPLANT
1 series · 13 of 25 positions shown · non-contrast
Comparison: None.

CLINICAL DATA: Abdomen and back pain. Ten days status post renal
transplant

EXAM:
ULTRASOUND OF RENAL TRANSPLANT
TECHNIQUE: Ultrasound examination of the renal transplant was performed with
gray-scale and color Doppler evaluation.

[Series 1: us renal transplant · 0.25mm/px · 13 of 43 slices shown]
[im 1/43]
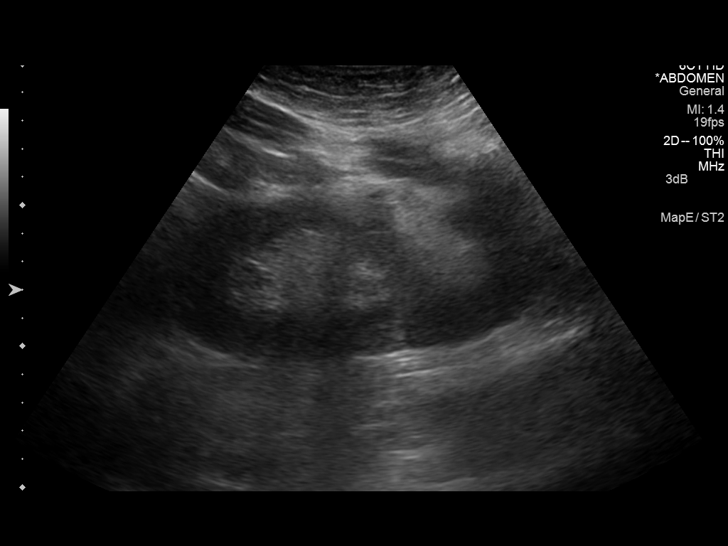
[im 4/43]
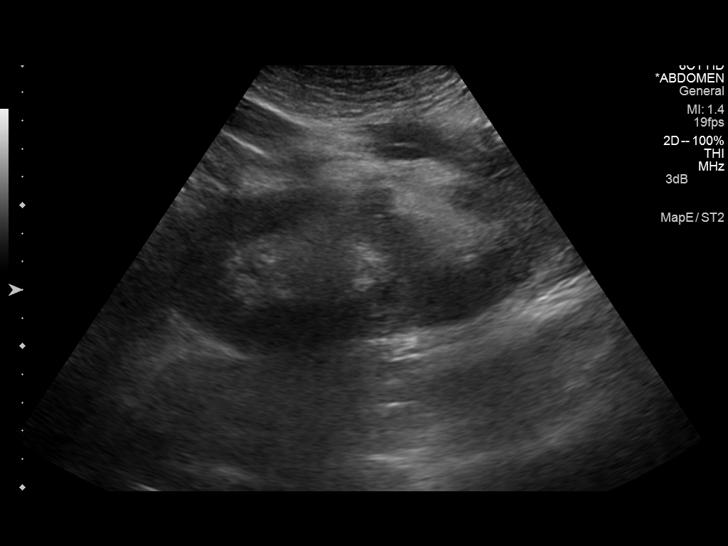
[im 8/43]
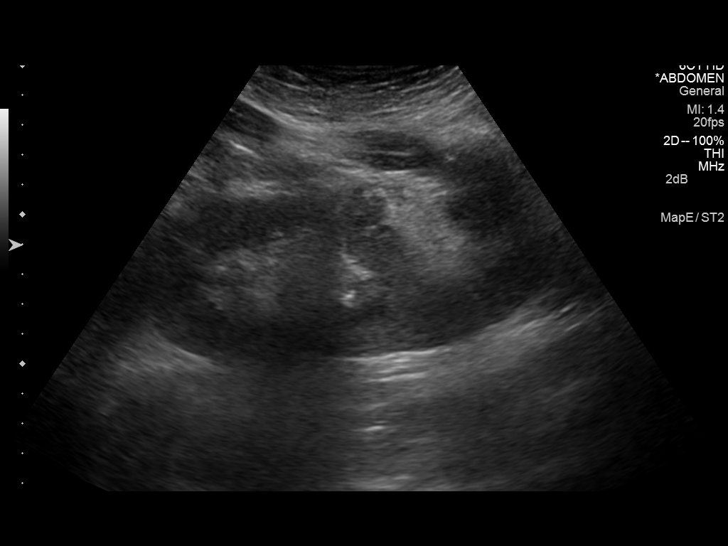
[im 11/43]
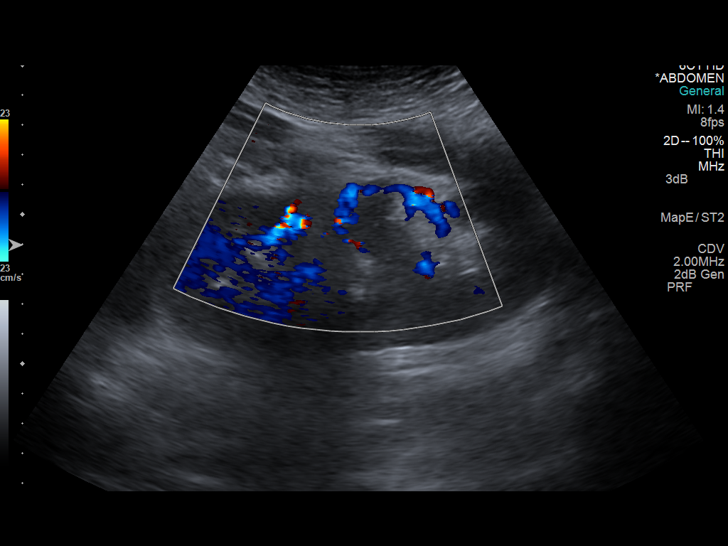
[im 15/43]
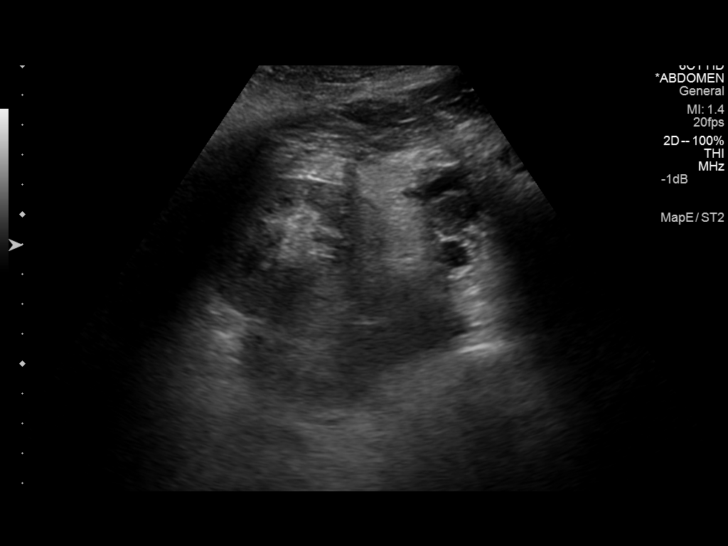
[im 18/43]
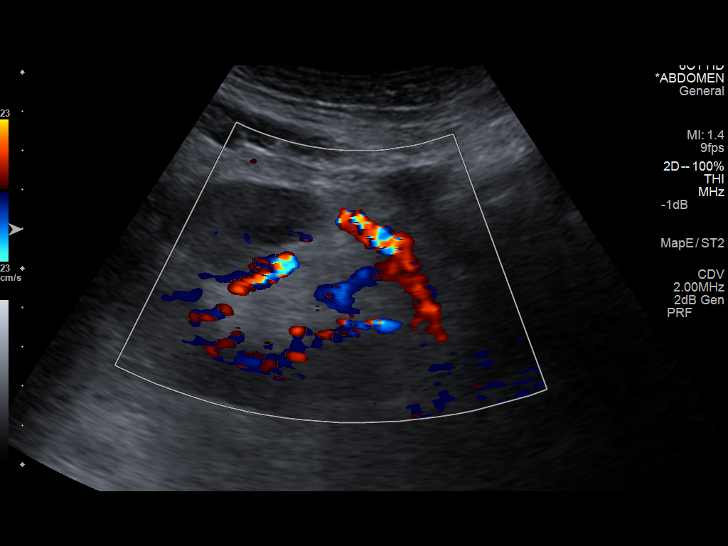
[im 22/43]
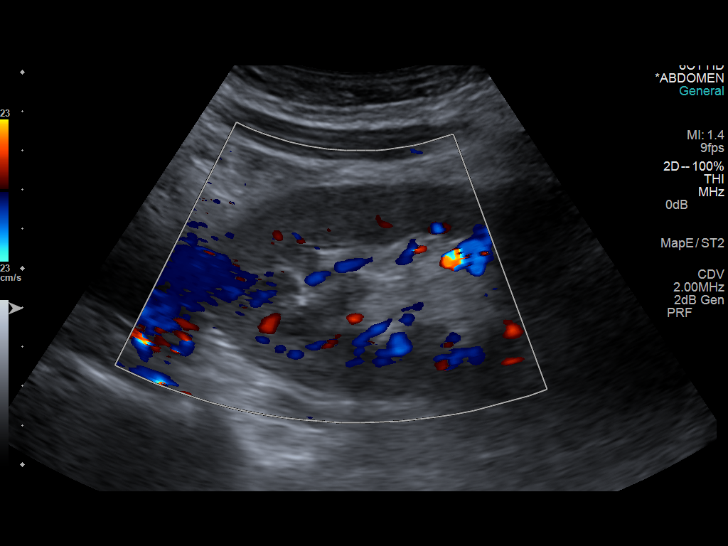
[im 25/43]
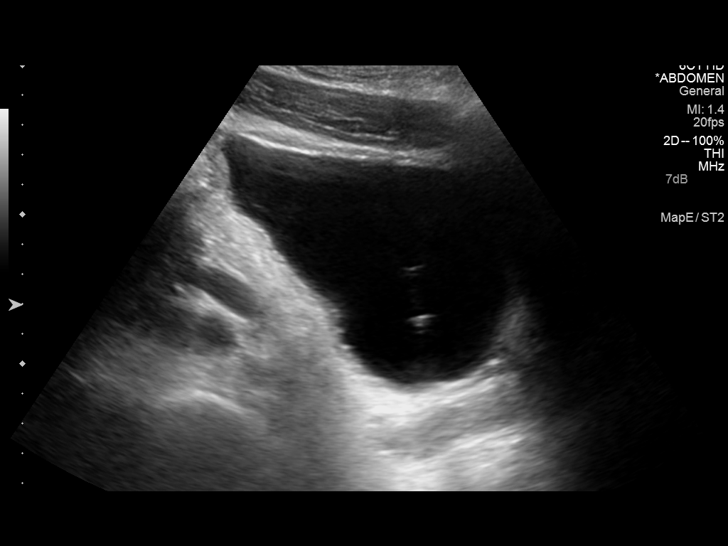
[im 29/43]
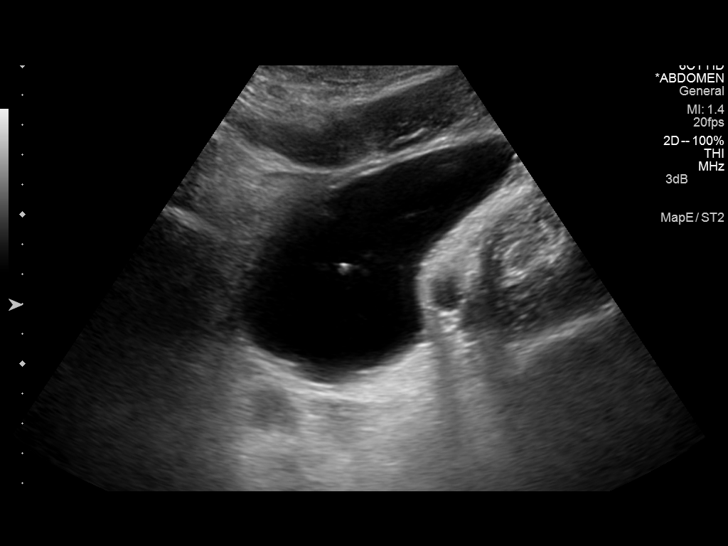
[im 32/43]
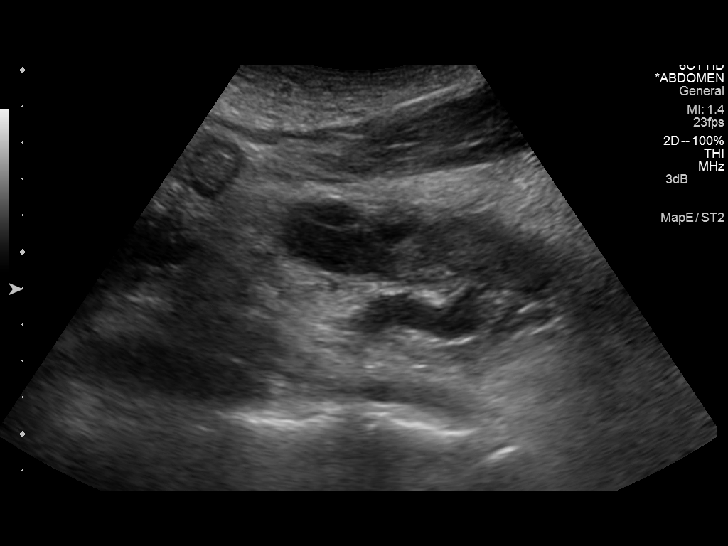
[im 36/43]
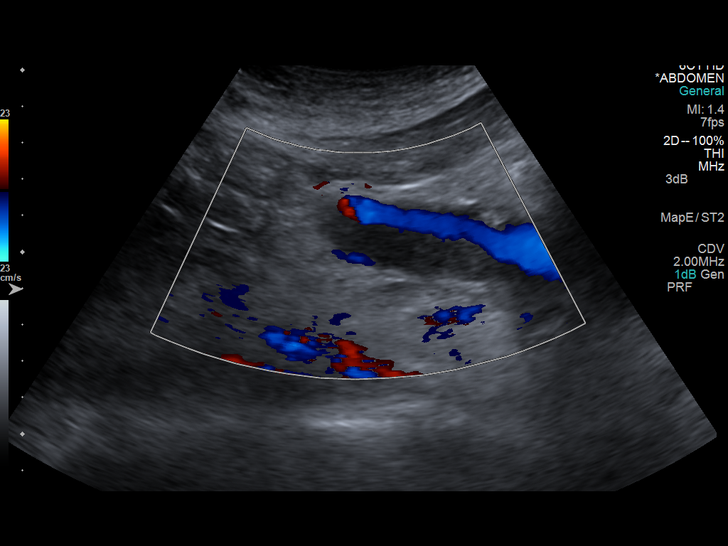
[im 39/43]
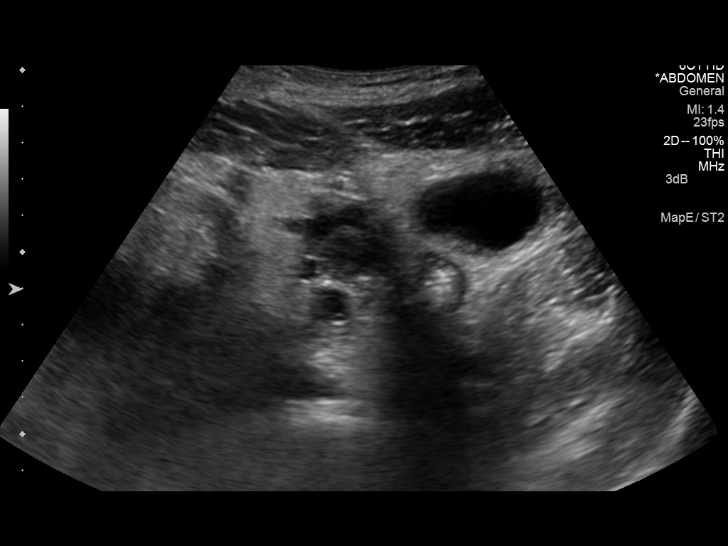
[im 43/43]
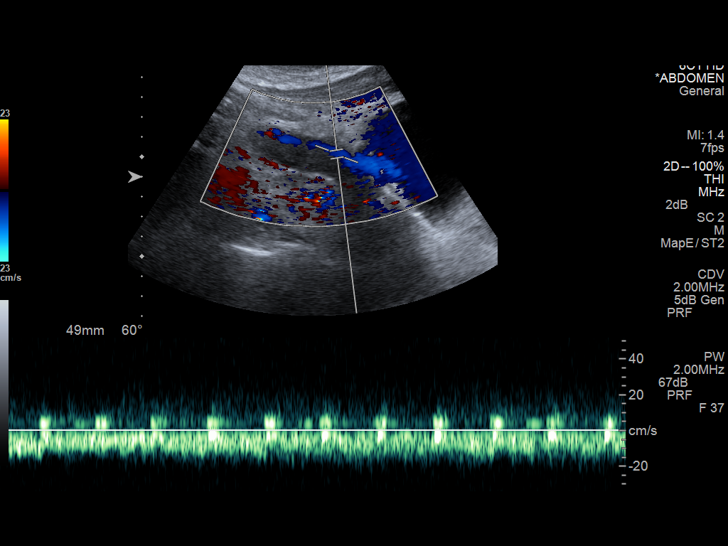

[13 of 25 positions shown; findings below may reference images not displayed]

FINDINGS: Transplant kidney location:  Right lower quadrant

Transplant kidney description: Normal in size and parenchymal
echogenicity. No evidence of mass or hydronephrosis.

Color flow in the main renal artery at the hilum:  Present.

Renal artery resistive indices are as follows:

Main renal artery:  1.0.

Upper pole corticomedullary junction:  1.0.

Lower pole corticomedullary junction:  1.0.

Color flow in the main renal vein at the hilum: There is
nonocclusive thrombus in the renal vein causing approximately 60%
narrowing. It is hypoechoic with round edges suggesting acute
nature. There is good flow through the patent portion of the renal
vein.

Bladder: Ureteral stent is noted.  Otherwise within normal limits.

Other findings:  No perinephric fluid collection.
IMPRESSION: There is no evidence of hydronephrosis. The kidney is normal in
appearance.

Nonocclusive acute appearing thrombus in the renal vein.

Resistive indices as described above. They are somewhat higher than
expected. Close follow-up is warranted.

## 2016-04-19 DIAGNOSIS — N2581 Secondary hyperparathyroidism of renal origin: Secondary | ICD-10-CM | POA: Diagnosis not present

## 2016-04-19 DIAGNOSIS — E559 Vitamin D deficiency, unspecified: Secondary | ICD-10-CM | POA: Diagnosis not present

## 2016-04-19 DIAGNOSIS — Z940 Kidney transplant status: Secondary | ICD-10-CM | POA: Diagnosis not present

## 2016-04-26 DIAGNOSIS — Z6834 Body mass index (BMI) 34.0-34.9, adult: Secondary | ICD-10-CM | POA: Diagnosis not present

## 2016-04-26 DIAGNOSIS — Z940 Kidney transplant status: Secondary | ICD-10-CM | POA: Diagnosis not present

## 2016-04-26 DIAGNOSIS — I158 Other secondary hypertension: Secondary | ICD-10-CM | POA: Diagnosis not present

## 2016-04-26 DIAGNOSIS — Z949 Transplanted organ and tissue status, unspecified: Secondary | ICD-10-CM | POA: Diagnosis not present

## 2016-04-26 DIAGNOSIS — N183 Chronic kidney disease, stage 3 (moderate): Secondary | ICD-10-CM | POA: Diagnosis not present

## 2016-04-26 DIAGNOSIS — N2581 Secondary hyperparathyroidism of renal origin: Secondary | ICD-10-CM | POA: Diagnosis not present

## 2016-04-26 DIAGNOSIS — Z862 Personal history of diseases of the blood and blood-forming organs and certain disorders involving the immune mechanism: Secondary | ICD-10-CM | POA: Diagnosis not present

## 2016-05-03 DIAGNOSIS — E119 Type 2 diabetes mellitus without complications: Secondary | ICD-10-CM | POA: Diagnosis not present

## 2016-05-03 DIAGNOSIS — Z79899 Other long term (current) drug therapy: Secondary | ICD-10-CM | POA: Diagnosis not present

## 2016-05-03 DIAGNOSIS — Z940 Kidney transplant status: Secondary | ICD-10-CM | POA: Diagnosis not present

## 2016-05-03 DIAGNOSIS — I10 Essential (primary) hypertension: Secondary | ICD-10-CM | POA: Diagnosis not present

## 2016-05-03 DIAGNOSIS — E871 Hypo-osmolality and hyponatremia: Secondary | ICD-10-CM | POA: Diagnosis not present

## 2016-05-03 DIAGNOSIS — I120 Hypertensive chronic kidney disease with stage 5 chronic kidney disease or end stage renal disease: Secondary | ICD-10-CM | POA: Diagnosis not present

## 2016-05-03 DIAGNOSIS — Z131 Encounter for screening for diabetes mellitus: Secondary | ICD-10-CM | POA: Diagnosis not present

## 2016-05-03 DIAGNOSIS — D899 Disorder involving the immune mechanism, unspecified: Secondary | ICD-10-CM | POA: Diagnosis not present

## 2016-05-03 DIAGNOSIS — E785 Hyperlipidemia, unspecified: Secondary | ICD-10-CM | POA: Diagnosis not present

## 2016-05-03 DIAGNOSIS — D8989 Other specified disorders involving the immune mechanism, not elsewhere classified: Secondary | ICD-10-CM | POA: Diagnosis not present

## 2016-05-03 DIAGNOSIS — Z4822 Encounter for aftercare following kidney transplant: Secondary | ICD-10-CM | POA: Diagnosis not present

## 2016-05-03 DIAGNOSIS — R809 Proteinuria, unspecified: Secondary | ICD-10-CM | POA: Diagnosis not present

## 2016-05-03 DIAGNOSIS — Z992 Dependence on renal dialysis: Secondary | ICD-10-CM | POA: Diagnosis not present

## 2016-05-03 DIAGNOSIS — Z6841 Body Mass Index (BMI) 40.0 and over, adult: Secondary | ICD-10-CM | POA: Diagnosis not present

## 2016-05-03 DIAGNOSIS — D649 Anemia, unspecified: Secondary | ICD-10-CM | POA: Diagnosis not present

## 2016-05-03 DIAGNOSIS — N186 End stage renal disease: Secondary | ICD-10-CM | POA: Diagnosis not present

## 2016-05-03 DIAGNOSIS — R634 Abnormal weight loss: Secondary | ICD-10-CM | POA: Diagnosis not present

## 2016-05-09 DIAGNOSIS — Z0000 Encounter for general adult medical examination without abnormal findings: Secondary | ICD-10-CM | POA: Diagnosis not present

## 2016-07-31 DIAGNOSIS — Z792 Long term (current) use of antibiotics: Secondary | ICD-10-CM | POA: Diagnosis not present

## 2016-07-31 DIAGNOSIS — Z131 Encounter for screening for diabetes mellitus: Secondary | ICD-10-CM | POA: Diagnosis not present

## 2016-07-31 DIAGNOSIS — E785 Hyperlipidemia, unspecified: Secondary | ICD-10-CM | POA: Diagnosis not present

## 2016-07-31 DIAGNOSIS — Z79899 Other long term (current) drug therapy: Secondary | ICD-10-CM | POA: Diagnosis not present

## 2016-07-31 DIAGNOSIS — R809 Proteinuria, unspecified: Secondary | ICD-10-CM | POA: Diagnosis not present

## 2016-07-31 DIAGNOSIS — N186 End stage renal disease: Secondary | ICD-10-CM | POA: Diagnosis not present

## 2016-07-31 DIAGNOSIS — D8989 Other specified disorders involving the immune mechanism, not elsewhere classified: Secondary | ICD-10-CM | POA: Diagnosis not present

## 2016-07-31 DIAGNOSIS — I120 Hypertensive chronic kidney disease with stage 5 chronic kidney disease or end stage renal disease: Secondary | ICD-10-CM | POA: Diagnosis not present

## 2016-07-31 DIAGNOSIS — Z4822 Encounter for aftercare following kidney transplant: Secondary | ICD-10-CM | POA: Diagnosis not present

## 2016-07-31 DIAGNOSIS — R808 Other proteinuria: Secondary | ICD-10-CM | POA: Diagnosis not present

## 2016-07-31 DIAGNOSIS — D649 Anemia, unspecified: Secondary | ICD-10-CM | POA: Diagnosis not present

## 2016-07-31 DIAGNOSIS — Z940 Kidney transplant status: Secondary | ICD-10-CM | POA: Diagnosis not present

## 2016-07-31 DIAGNOSIS — D899 Disorder involving the immune mechanism, unspecified: Secondary | ICD-10-CM | POA: Diagnosis not present

## 2016-07-31 DIAGNOSIS — I10 Essential (primary) hypertension: Secondary | ICD-10-CM | POA: Diagnosis not present

## 2016-08-07 DIAGNOSIS — Z792 Long term (current) use of antibiotics: Secondary | ICD-10-CM | POA: Diagnosis not present

## 2016-08-07 DIAGNOSIS — Z7952 Long term (current) use of systemic steroids: Secondary | ICD-10-CM | POA: Diagnosis not present

## 2016-08-07 DIAGNOSIS — R809 Proteinuria, unspecified: Secondary | ICD-10-CM | POA: Diagnosis not present

## 2016-08-07 DIAGNOSIS — R7989 Other specified abnormal findings of blood chemistry: Secondary | ICD-10-CM | POA: Diagnosis not present

## 2016-08-07 DIAGNOSIS — Z79899 Other long term (current) drug therapy: Secondary | ICD-10-CM | POA: Diagnosis not present

## 2016-08-07 DIAGNOSIS — Z4822 Encounter for aftercare following kidney transplant: Secondary | ICD-10-CM | POA: Diagnosis not present

## 2016-08-17 DIAGNOSIS — N2581 Secondary hyperparathyroidism of renal origin: Secondary | ICD-10-CM | POA: Diagnosis not present

## 2016-08-17 DIAGNOSIS — E559 Vitamin D deficiency, unspecified: Secondary | ICD-10-CM | POA: Diagnosis not present

## 2016-08-17 DIAGNOSIS — Z940 Kidney transplant status: Secondary | ICD-10-CM | POA: Diagnosis not present

## 2016-08-21 DIAGNOSIS — N183 Chronic kidney disease, stage 3 (moderate): Secondary | ICD-10-CM | POA: Diagnosis not present

## 2016-08-21 DIAGNOSIS — Z940 Kidney transplant status: Secondary | ICD-10-CM | POA: Diagnosis not present

## 2016-08-21 DIAGNOSIS — Z6832 Body mass index (BMI) 32.0-32.9, adult: Secondary | ICD-10-CM | POA: Diagnosis not present

## 2016-08-21 DIAGNOSIS — N2581 Secondary hyperparathyroidism of renal origin: Secondary | ICD-10-CM | POA: Diagnosis not present

## 2016-08-21 DIAGNOSIS — I158 Other secondary hypertension: Secondary | ICD-10-CM | POA: Diagnosis not present

## 2016-08-21 DIAGNOSIS — Z949 Transplanted organ and tissue status, unspecified: Secondary | ICD-10-CM | POA: Diagnosis not present

## 2016-08-25 DIAGNOSIS — I10 Essential (primary) hypertension: Secondary | ICD-10-CM | POA: Diagnosis not present

## 2016-08-25 DIAGNOSIS — Z792 Long term (current) use of antibiotics: Secondary | ICD-10-CM | POA: Diagnosis not present

## 2016-08-25 DIAGNOSIS — E785 Hyperlipidemia, unspecified: Secondary | ICD-10-CM | POA: Diagnosis not present

## 2016-08-25 DIAGNOSIS — Z79899 Other long term (current) drug therapy: Secondary | ICD-10-CM | POA: Diagnosis not present

## 2016-08-25 DIAGNOSIS — D8989 Other specified disorders involving the immune mechanism, not elsewhere classified: Secondary | ICD-10-CM | POA: Diagnosis not present

## 2016-08-25 DIAGNOSIS — Z131 Encounter for screening for diabetes mellitus: Secondary | ICD-10-CM | POA: Diagnosis not present

## 2016-08-25 DIAGNOSIS — Z940 Kidney transplant status: Secondary | ICD-10-CM | POA: Diagnosis not present

## 2016-08-25 DIAGNOSIS — R808 Other proteinuria: Secondary | ICD-10-CM | POA: Diagnosis not present

## 2016-08-25 DIAGNOSIS — D649 Anemia, unspecified: Secondary | ICD-10-CM | POA: Diagnosis not present

## 2017-03-29 ENCOUNTER — Other Ambulatory Visit: Payer: BC Managed Care – PPO

## 2017-03-29 ENCOUNTER — Encounter (INDEPENDENT_AMBULATORY_CARE_PROVIDER_SITE_OTHER): Payer: Self-pay

## 2017-03-29 ENCOUNTER — Encounter: Payer: Self-pay | Admitting: Gastroenterology

## 2017-03-29 ENCOUNTER — Ambulatory Visit: Payer: Medicare Other | Admitting: Gastroenterology

## 2017-03-29 VITALS — BP 118/76 | HR 80 | Ht 68.25 in | Wt 185.5 lb

## 2017-03-29 DIAGNOSIS — D899 Disorder involving the immune mechanism, unspecified: Secondary | ICD-10-CM | POA: Diagnosis not present

## 2017-03-29 DIAGNOSIS — R197 Diarrhea, unspecified: Secondary | ICD-10-CM | POA: Diagnosis not present

## 2017-03-29 DIAGNOSIS — D849 Immunodeficiency, unspecified: Secondary | ICD-10-CM

## 2017-03-29 DIAGNOSIS — Z940 Kidney transplant status: Secondary | ICD-10-CM | POA: Diagnosis not present

## 2017-03-29 NOTE — Patient Instructions (Addendum)
If you are age 44 or older, your body mass index should be between 23-30. Your Body mass index is 28 kg/m. If this is out of the aforementioned range listed, please consider follow up with your Primary Care Provider.  If you are age 33 or younger, your body mass index should be between 19-25. Your Body mass index is 28 kg/m. If this is out of the aformentioned range listed, please consider follow up with your Primary Care Provider.   Please go to the lab in the basement of our building to have lab work done as you leave today.  Thank you for entrusting me with your care and for [facility], Dr. Poynor Cellar

## 2017-03-29 NOTE — Progress Notes (Signed)
HPI :  42 y/o male with CKD s/p renal transplant, HTN, referred here by Dr. Elmarie Shiley for a new patient consult for diarrhea and abdominal pain.   Patient reports diarrhea ongoing for the past 3-4 weeks. He states he has roughly 6-7 loose bowel movement per day. Reports eating often stimulates loose bowel movements, and also after he takes his immunosuppressive regimen - he is on tacrolimus and mycophenolate mofetil. He had a renal transplant in May 2015, and has been on this regimen since. More recently his creatinine has been rising, followed closely by nephrology. He does endorse some urgency with his stools, but denies any blood.. At baseline he previously had one bowel movement per day. He has some occasional mid abdominal discomfort with the diarrhea. He thinks he has lost a few pounds recently. He denies any fevers. He has been taking prophylactic Bactrim in light of his immunosuppression.  He denies any family history of colon cancer or history of inflammatory bowel disease. He has never had a prior colonoscopy  Recent labs: 03/26/2017 WBC 6.2, Hgb 13.1, BUN 24, Cr 2.93,  AP 110, AST 20, ALT 21  Past Medical History:  Diagnosis Date  . 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 Arteriovenous fistula ([place])   . Chronic kidney disease   . Hyperparathyroidism ([place])   . Hypertension   . Metabolic bone disease      Past Surgical History:  Procedure Laterality Date  . AV FISTULA PLACEMENT  08/01/2011   Procedure: ARTERIOVENOUS (AV) FISTULA CREATION;  Surgeon: Elam Dutch, MD;  Location: [facility];  Service: Vascular;  Laterality: Left;  . INSERTION OF DIALYSIS CATHETER  08/01/2011   Procedure: INSERTION OF DIALYSIS CATHETER;  Surgeon: Elam Dutch, MD;  Location: [facility];  Service: Vascular;  Laterality: Right;  Right Internal Jugular with 23 cm Dialysis Catheter  . NEPHRECTOMY TRANSPLANTED ORGAN Right   . SHOULDER SURGERY Bilateral    20 years ago   Family History  Problem  Relation Age of Onset  . Hypertension Mother   . Diabetes Mother   . Hypertension Father   . Cancer Maternal Grandmother        type unknown   Social History   Tobacco Use  . Smoking status: Never Smoker  . Smokeless tobacco: Never Used  Substance Use Topics  . Alcohol use: Yes    Comment: social  . Drug use: No   Current Outpatient Medications  Medication Sig Dispense Refill  . amLODipine (NORVASC) 10 MG tablet Take 10 mg by mouth daily.    Marland Kitchen LOSARTAN POTASSIUM PO Take 50 mg by mouth 2 (two) times daily.    . magnesium oxide (MAG-OX) 400 MG tablet Take 1,200 mg by mouth daily.    . metoprolol tartrate (LOPRESSOR) 25 MG tablet Take 25 mg by mouth 2 (two) times daily.    . mycophenolate (MYFORTIC) 180 MG EC tablet Take 320 mg by mouth 2 (two) times daily.    Marland Kitchen omeprazole (PRILOSEC) 20 MG capsule Take 20 mg by mouth daily.    . predniSONE (DELTASONE) 5 MG tablet Take 5 mg by mouth daily with breakfast.    . sulfamethoxazole-trimethoprim (BACTRIM DS) 800-160 MG per tablet Take 1 tablet by mouth See admin instructions. Take one tablet on Monday, Wednesday, & Friday.    . tacrolimus (PROGRAF) 1 MG capsule Take 8 mg by mouth 2 (two) times daily.    . tacrolimus (PROGRAF) 1 MG capsule Take 1 capsule by  mouth 2 (two) times daily.    . tacrolimus (PROGRAF) 5 MG capsule Take 1 capsule by mouth 2 (two) times daily.     No current facility-administered medications for this visit.    Allergies  Allergen Reactions  . Verapamil Other (See Comments)    Pt not sure why we have this listed     Review of Systems: All systems reviewed and negative except where noted in HPI.   Per HPI above  Physical Exam: BP 118/76 (BP Location: Right Arm, Patient Position: Sitting, Cuff Size: Normal)   Pulse 80   Ht 5' 8.25" (1.734 m) Comment: height measured without shoes  Wt 185 lb 8 oz (84.1 kg)   BMI 28.00 kg/m  Constitutional: Pleasant,well-developed, male in no acute distress. HEENT:  Normocephalic and atraumatic. Conjunctivae are normal. No scleral icterus. Neck supple.  Cardiovascular: Normal rate, regular rhythm.  Pulmonary/chest: Effort normal and breath sounds normal. No wheezing, rales or rhonchi. Abdominal: Soft, nondistended, nontender. There are no masses palpable. No hepatomegaly. Extremities: no edema Lymphadenopathy: No cervical adenopathy noted. Neurological: Alert and oriented to person place and time. Skin: Skin is warm and dry. No rashes noted. Psychiatric: Normal mood and affect. Behavior is normal.   ASSESSMENT AND PLAN: 42 year old male with a history of renal transplant on chronic immunosuppressive regimen, presenting with a few weeks of acute diarrhea. I discussed differential for him which includes infectious, versus medication related (mycophenolate induced colitis has been reported, tacrolimus can also cause diarrhea), versus other inflammatory diarrhea versus functional. I'm recommending a GI pathogen panel initially, rule out C. difficile given his antibiotic prophylaxis, and rule out other infectious etiologies. If this is negative I think a colonoscopy is reasonable to further evaluate. I discussed risks and benefits of colonoscopy and anesthesia with him, and he wanted to proceed. If GI pathogen panel was negative, he can use Imodium in the interim to help slow down his diarrhea. He agreed with the plan.   [place] Cellar, MD [facility] Pager [phone]  CC: Elmarie Shiley, MD

## 2017-04-02 LAB — GASTROINTESTINAL PATHOGEN PANEL PCR
C. DIFFICILE TOX A/B, PCR: NOT DETECTED
CRYPTOSPORIDIUM, PCR: NOT DETECTED
Campylobacter, PCR: NOT DETECTED
E COLI (ETEC) LT/ST, PCR: NOT DETECTED
E COLI (STEC) STX1/STX2, PCR: NOT DETECTED
E COLI 0157, PCR: NOT DETECTED
Giardia lamblia, PCR: NOT DETECTED
Norovirus, PCR: DETECTED — AB
ROTAVIRUS, PCR: NOT DETECTED
Salmonella, PCR: NOT DETECTED
Shigella, PCR: NOT DETECTED

## 2017-04-03 ENCOUNTER — Telehealth: Payer: Self-pay

## 2017-04-03 NOTE — Telephone Encounter (Signed)
Spoke to patient, let him know about recent lab results and recommendations. Patient understands to contact our office if not better in 2 weeks.

## 2017-09-07 ENCOUNTER — Encounter: Payer: Self-pay | Admitting: Nephrology

## 2017-11-27 DIAGNOSIS — Z940 Kidney transplant status: Secondary | ICD-10-CM | POA: Insufficient documentation

## 2017-11-27 HISTORY — DX: Kidney transplant status: Z94.0

## 2017-11-27 NOTE — Progress Notes (Deleted)
Cardiology Office Note:    Date:  11/27/2017   ID:  Charles [name], DOB September 25, 1975, MRN [id number]  PCP:  Elmarie Shiley, MD  Cardiologist:  No primary care provider on file.   Referring MD: Elmarie Shiley, MD   No chief complaint on file. ***  History of Present Illness:    Charles [name] is a 42 y.o. male with a hx of ESRD and successful renal transplant, essential hypertension, metabolic bone diease referred for cardiology consultation by *** to evaluate for ***.  Past Medical History:  Diagnosis Date  . 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 Arteriovenous fistula ([place])   . Chronic kidney disease   . Hyperparathyroidism ([place])   . Hypertension   . Metabolic bone disease     Past Surgical History:  Procedure Laterality Date  . AV FISTULA PLACEMENT  08/01/2011   Procedure: ARTERIOVENOUS (AV) FISTULA CREATION;  Surgeon: Elam Dutch, MD;  Location: [place];  Service: Vascular;  Laterality: Left;  . INSERTION OF DIALYSIS CATHETER  08/01/2011   Procedure: INSERTION OF DIALYSIS CATHETER;  Surgeon: Elam Dutch, MD;  Location: [place];  Service: Vascular;  Laterality: Right;  Right Internal Jugular with 23 cm Dialysis Catheter  . NEPHRECTOMY TRANSPLANTED ORGAN Right   . SHOULDER SURGERY Bilateral    20 years ago    Current Medications: No outpatient medications have been marked as taking for the 11/28/17 encounter (Appointment) with Belva Crome, MD.     Allergies:   Verapamil   Social History   Socioeconomic History  . Marital status: Single    Spouse name: Not on file  . Number of children: 4  . Years of education: Not on file  . Highest education level: Not on file  Occupational History  . Occupation: Administrator, sports  Social Needs  . Financial resource strain: Not on file  . Food insecurity:    Worry: Not on file    Inability: Not on file  . Transportation needs:    Medical: Not on file    Non-medical: Not on file  Tobacco Use  . Smoking  status: Never Smoker  . Smokeless tobacco: Never Used  Substance and Sexual Activity  . Alcohol use: Yes    Comment: social  . Drug use: No  . Sexual activity: Not on file  Lifestyle  . Physical activity:    Days per week: Not on file    Minutes per session: Not on file  . Stress: Not on file  Relationships  . Social connections:    Talks on phone: Not on file    Gets together: Not on file    Attends religious service: Not on file    Active member of club or organization: Not on file    Attends meetings of clubs or organizations: Not on file    Relationship status: Not on file  Other Topics Concern  . Not on file  Social History Narrative   Works for [employer] for [employer].   Lives in [place] with wife and 4 kids.     Family History: The patient's ***family history includes Cancer in his maternal grandmother; Diabetes in his mother; Hypertension in his father and mother.  ROS:   Please see the history of present illness.    *** All other systems reviewed and are negative.  EKGs/Labs/Other Studies Reviewed:    The following studies were reviewed today: ***  EKG:  EKG is *** ordered today.  The  ekg ordered today demonstrates ***  Recent Labs: No results found for requested labs within last 8760 hours.  Recent Lipid Panel No results found for: CHOL, TRIG, HDL, CHOLHDL, VLDL, LDLCALC, LDLDIRECT  Physical Exam:    VS:  There were no vitals taken for this visit.    Wt Readings from Last 3 Encounters:  03/29/17 185 lb 8 oz (84.1 kg)  08/05/13 200 lb (90.7 kg)  10/01/12 192 lb (87.1 kg)     GEN: *** Well nourished, well developed in no acute distress HEENT: Normal NECK: No JVD. LYMPHATICS: No lymphadenopathy CARDIAC: ***RRR, ***murmur, ***gallop, *** edema. VASCULAR: *** pulses. *** bruits. RESPIRATORY:  Clear to auscultation without rales, wheezing or rhonchi  ABDOMEN: Soft, non-tender, non-distended, No pulsatile mass, MUSCULOSKELETAL: No  deformity  SKIN: Warm and dry NEUROLOGIC:  Alert and oriented x 3 PSYCHIATRIC:  Normal affect   ASSESSMENT:    1. End stage renal disease (HCC)   2. Thrombocytopenia (HCC)    PLAN:    In order of problems listed above:  1. ***   Medication Adjustments/Labs and Tests Ordered: Current medicines are reviewed at length with the patient today.  Concerns regarding medicines are outlined above.  No orders of the defined types were placed in this encounter.  No orders of the defined types were placed in this encounter.   There are no Patient Instructions on file for this visit.   Signed, Sinclair Grooms, MD  11/27/2017 5:14 PM    [facility]

## 2017-11-28 ENCOUNTER — Ambulatory Visit: Payer: BC Managed Care – PPO | Admitting: Interventional Cardiology

## 2017-12-25 ENCOUNTER — Encounter: Payer: Self-pay | Admitting: Cardiology

## 2017-12-25 ENCOUNTER — Ambulatory Visit (INDEPENDENT_AMBULATORY_CARE_PROVIDER_SITE_OTHER): Payer: BC Managed Care – PPO | Admitting: Cardiology

## 2017-12-25 VITALS — BP 118/90 | HR 73 | Ht 68.25 in | Wt 180.4 lb

## 2017-12-25 DIAGNOSIS — I10 Essential (primary) hypertension: Secondary | ICD-10-CM

## 2017-12-25 DIAGNOSIS — T8611 Kidney transplant rejection: Secondary | ICD-10-CM

## 2017-12-25 DIAGNOSIS — N186 End stage renal disease: Secondary | ICD-10-CM

## 2017-12-25 NOTE — Patient Instructions (Signed)
Medication Instructions:  The current medical regimen is effective;  continue present plan and medications.  Follow-Up: Follow up as needed with Dr Skains.  Thank you for choosing [facility]!!     

## 2017-12-25 NOTE — Progress Notes (Signed)
Cardiology Office Note:    Date:  12/25/2017   ID:  Charles [name], DOB 10-14-1975, MRN [id number]  PCP:  Elmarie Shiley, MD  Cardiologist:  No primary care provider on file.  Electrophysiologist:  None   Referring MD: Elmarie Shiley, MD     History of Present Illness:    Charles [name] is a 42 y.o. male for evaluation of uncontrolled hypertension at the request of Dr. Graylon Gunning of nephrology.  Currently on amlodipine 10, losartan 50 twice daily, metoprolol 25 twice a day for his blood pressure.  He is also taking prednisone 5 mg daily with breakfast.  Takes tacrolimus as well.  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  Unfortunately back in early 2019 he began to have rejection and is now back on hemodialysis.  During the initial state of rejection, inflammation, his blood pressures were very difficult to control.  As they have been able to bring down his dry weight to 79 kg, his blood pressure has markedly improved.  Prior to his renal transplant, he had a stress test which was normal.  He denies any chest pain, shortness of breath, neurologic symptoms.  Today in clinic his blood pressure is 118/90.    Past Medical History:  Diagnosis Date  . 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 Anemia of chronic kidney failure 07/30/2011  . Arteriovenous fistula ([place])   . Chronic kidney disease   . Chronic kidney disease (CKD), stage V ([place]) 05/03/2009   Qualifier: Diagnosis of  By: Al-Rammal, RVT, RDCS, Missy    . End-stage renal disease on hemodialysis ([place]) 08/09/2011  . History of renal transplant 11/27/2017  . Hyperparathyroidism ([place])   . Hypertension   . Metabolic bone disease   . Thrombocytopenia ([place]) 08/09/2011  . Uremia syndrome 07/30/2011    Past Surgical History:  Procedure Laterality Date  . AV FISTULA PLACEMENT  08/01/2011   Procedure: ARTERIOVENOUS (AV) FISTULA CREATION;  Surgeon: Elam Dutch, MD;  Location: [facility];  Service: Vascular;   Laterality: Left;  . INSERTION OF DIALYSIS CATHETER  08/01/2011   Procedure: INSERTION OF DIALYSIS CATHETER;  Surgeon: Elam Dutch, MD;  Location: [facility];  Service: Vascular;  Laterality: Right;  Right Internal Jugular with 23 cm Dialysis Catheter  . NEPHRECTOMY TRANSPLANTED ORGAN Right   . SHOULDER SURGERY Bilateral    20 years ago    Current Medications: Current Meds  Medication Sig  . amLODipine (NORVASC) 10 MG tablet Take 10 mg by mouth daily.  Marland Kitchen LOSARTAN POTASSIUM PO Take 50 mg by mouth 2 (two) times daily.  . magnesium oxide (MAG-OX) 400 MG tablet Take 1,200 mg by mouth daily.  . metoprolol tartrate (LOPRESSOR) 25 MG tablet Take 25 mg by mouth 2 (two) times daily.  . mycophenolate (MYFORTIC) 180 MG EC tablet Take 320 mg by mouth 2 (two) times daily.  Marland Kitchen omeprazole (PRILOSEC) 20 MG capsule Take 20 mg by mouth daily.  . predniSONE (DELTASONE) 5 MG tablet Take 5 mg by mouth daily with breakfast.  . sulfamethoxazole-trimethoprim (BACTRIM DS) 800-160 MG per tablet Take 1 tablet by mouth See admin instructions. Take one tablet on Monday, Wednesday, & Friday.  . tacrolimus (PROGRAF) 5 MG capsule Take 1 capsule by mouth 2 (two) times daily.     Allergies:   Verapamil   Social History   Socioeconomic History  . Marital status: Single    Spouse name: Not on file  .  Number of children: 4  . Years of education: Not on file  . Highest education level: Not on file  Occupational History  . Occupation: Administrator, sports  Social Needs  . Financial resource strain: Not on file  . Food insecurity:    Worry: Not on file    Inability: Not on file  . Transportation needs:    Medical: Not on file    Non-medical: Not on file  Tobacco Use  . Smoking status: Never Smoker  . Smokeless tobacco: Never Used  Substance and Sexual Activity  . Alcohol use: Yes    Comment: social  . Drug use: No  . Sexual activity: Not on file  Lifestyle  . Physical activity:    Days per week:  Not on file    Minutes per session: Not on file  . Stress: Not on file  Relationships  . Social connections:    Talks on phone: Not on file    Gets together: Not on file    Attends religious service: Not on file    Active member of club or organization: Not on file    Attends meetings of clubs or organizations: Not on file    Relationship status: Not on file  Other Topics Concern  . Not on file  Social History Narrative   Works for [employer] for [employer].   Lives in [place] with wife and 4 kids.     Family History: The patient's family history includes Cancer in his maternal grandmother; Diabetes in his mother; Hypertension in his father and mother.  ROS:   Please see the history of present illness.    No fevers chills nausea vomiting syncope bleeding all other systems reviewed and are negative.  EKGs/Labs/Other Studies Reviewed:    The following studies were reviewed today: Prior stress test reassuring.  EKG LVH.  Prior lab work, office notes in care everywhere reviewed  EKG:  EKG is  ordered today.  The ekg ordered today demonstrates sinus rhythm 73 LVH pattern, subtle T wave changes.  Recent Labs: No results found for requested labs within last 8760 hours.  Recent Lipid Panel No results found for: CHOL, TRIG, HDL, CHOLHDL, VLDL, LDLCALC, LDLDIRECT  Physical Exam:    VS:  BP 118/90   Pulse 73   Ht 5' 8.25" (1.734 m)   Wt 180 lb 6.4 oz (81.8 kg)   SpO2 97%   BMI 27.23 kg/m     Wt Readings from Last 3 Encounters:  12/25/17 180 lb 6.4 oz (81.8 kg)  03/29/17 185 lb 8 oz (84.1 kg)  08/05/13 200 lb (90.7 kg)     GEN:  Well nourished, well developed in no acute distress HEENT: Normal NECK: No JVD; No carotid bruits LYMPHATICS: No lymphadenopathy CARDIAC: RRR, no murmurs, rubs, gallops RESPIRATORY:  Clear to auscultation without rales, wheezing or rhonchi  ABDOMEN: Soft, non-tender, non-distended MUSCULOSKELETAL:  No edema; No deformity  SKIN:  Warm and dry, left arm fistula noted NEUROLOGIC:  Alert and oriented x 3 PSYCHIATRIC:  Normal affect   ASSESSMENT:    1. Accelerated hypertension   2. Renal transplant rejection   3. End stage renal disease (HCC)    PLAN:    In order of problems listed above:  Elevated blood pressure prior accelerated hypertension/essential hypertension in the setting of end-stage renal disease, renal transplant rejection - After reducing his overall dry weight, his blood pressure has markedly improved.  Today it is very reasonable.  I  did not make any changes in his medication but if his blood pressure were to elevate, one could consider increasing his metoprolol to 50 mg twice a day.  Continue with current medication strategy.  ECG does appear to have some evidence of LVH.  Continue to treat hypertension aggressively.  End-stage renal disease status post transplant with rejection - Currently on low-dose steroid and tacrolimus.  Trying for salvage.  Please let us know we can be of further assistance.   Medication Adjustments/Labs and Tests Ordered: Current medicines are reviewed at length with the patient today.  Concerns regarding medicines are outlined above.  Orders Placed This Encounter  Procedures  . EKG 12-Lead   No orders of the defined types were placed in this encounter.   Patient Instructions  Medication Instructions:  The current medical regimen is effective;  continue present plan and medications.  Follow-Up: Follow up as needed with Dr Marlou Porch.  Thank you for choosing [facility]!!         Signed, Candee Furbish, MD  12/25/2017 4:16 PM    [facility]

## 2018-01-04 ENCOUNTER — Encounter

## 2018-01-04 ENCOUNTER — Ambulatory Visit: Payer: BC Managed Care – PPO | Admitting: Cardiology

## 2018-04-24 ENCOUNTER — Other Ambulatory Visit: Payer: Self-pay

## 2018-04-24 DIAGNOSIS — N186 End stage renal disease: Secondary | ICD-10-CM

## 2018-04-24 DIAGNOSIS — Z992 Dependence on renal dialysis: Principal | ICD-10-CM

## 2018-05-20 ENCOUNTER — Other Ambulatory Visit: Payer: Self-pay

## 2018-05-20 ENCOUNTER — Encounter: Payer: Self-pay | Admitting: Surgery

## 2018-05-20 ENCOUNTER — Other Ambulatory Visit: Payer: Self-pay | Admitting: *Deleted

## 2018-05-20 ENCOUNTER — Encounter: Payer: Self-pay | Admitting: *Deleted

## 2018-05-20 ENCOUNTER — Ambulatory Visit (HOSPITAL_COMMUNITY)
Admission: RE | Admit: 2018-05-20 | Discharge: 2018-05-20 | Disposition: A | Discharge status: Home / Self Care | Payer: BC Managed Care – PPO | Source: Ambulatory Visit | Attending: Surgery | Admitting: Surgery

## 2018-05-20 ENCOUNTER — Ambulatory Visit (INDEPENDENT_AMBULATORY_CARE_PROVIDER_SITE_OTHER)
Admission: RE | Admit: 2018-05-20 | Discharge: 2018-05-20 | Disposition: A | Discharge status: Home / Self Care | Payer: BC Managed Care – PPO | Source: Ambulatory Visit | Attending: Surgery | Admitting: Surgery

## 2018-05-20 ENCOUNTER — Ambulatory Visit: Payer: BC Managed Care – PPO | Admitting: Surgery

## 2018-05-20 VITALS — BP 143/100 | HR 74 | Temp 98.0°F | Resp 20 | Ht 68.0 in | Wt 178.4 lb

## 2018-05-20 DIAGNOSIS — N186 End stage renal disease: Secondary | ICD-10-CM

## 2018-05-20 DIAGNOSIS — Z992 Dependence on renal dialysis: Secondary | ICD-10-CM

## 2018-05-20 NOTE — Progress Notes (Signed)
Vascular and Vein Specialist of [name]  Patient name: Charles [name] MRN: [id number] DOB: Jul 20, 1975 Sex: male   REQUESTING PROVIDER:    Dr. Posey Pronto   REASON FOR CONSULT:    Renal access  HISTORY OF PRESENT ILLNESS:   Charles [name] is a 43 y.o. male, who is referred for evaluation of dialysis access.  He most recently in 2013 had a left brachiocephalic fistula placed.  Patient renal failure secondary to hypertension.  He has a history of failed kidney transplant.  He is a non-smoker.  PAST MEDICAL HISTORY    Past Medical History:  Diagnosis Date  . 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 Anemia of chronic kidney failure 07/30/2011  . Arteriovenous fistula ([place])   . Chronic kidney disease   . Chronic kidney disease (CKD), stage V ([place]) 05/03/2009   Qualifier: Diagnosis of  By: Al-Rammal, RVT, RDCS, Missy    . End-stage renal disease on hemodialysis ([place]) 08/09/2011  . History of renal transplant 11/27/2017  . Hyperparathyroidism ([place])   . Hypertension   . Metabolic bone disease   . Thrombocytopenia ([place]) 08/09/2011  . 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 Hypertension Mother   . Diabetes Mother   . Hypertension Father   . Cancer Maternal Grandmother        type unknown    SOCIAL HISTORY:   Social History   Socioeconomic History  . Marital status: Single    Spouse name: Not on file  . Number of children: 4  . Years of education: Not on file  . Highest education level: Not on file  Occupational History  . Occupation: Administrator, sports  Social Needs  . Financial resource strain: Not on file  . Food insecurity:    Worry: Not on file    Inability: Not on file  . Transportation needs:    Medical: Not on file    Non-medical: Not on file  Tobacco Use  . Smoking status: Never Smoker  . Smokeless tobacco: Never Used  Substance and Sexual Activity  .  Alcohol use: Yes    Comment: social  . Drug use: No  . Sexual activity: Not on file  Lifestyle  . Physical activity:    Days per week: Not on file    Minutes per session: Not on file  . Stress: Not on file  Relationships  . Social connections:    Talks on phone: Not on file    Gets together: Not on file    Attends religious service: Not on file    Active member of club or organization: Not on file    Attends meetings of clubs or organizations: Not on file    Relationship status: Not on file  . Intimate partner violence:    Fear of current or ex partner: Not on file    Emotionally abused: Not on file    Physically abused: Not on file    Forced sexual activity: Not on file  Other Topics Concern  . Not on file  Social History Narrative   Works for [employer] for [employer].   Lives in [place] with wife and 4 kids.    ALLERGIES:    Allergies  Allergen Reactions  . Verapamil Other (See Comments)    Pt not sure why we have this listed    CURRENT MEDICATIONS:    Current Outpatient Medications  Medication Sig  Dispense Refill  . amLODipine (NORVASC) 10 MG tablet Take 10 mg by mouth daily.    . calcium acetate (PHOSLO) 667 MG capsule TAKE 2 CAPSULES BY MOUTH THREE TIMES DAILY WITH MEALS AND 1 CAPSULE TWO TIMES DAILY WITH SNACKS    . magnesium oxide (MAG-OX) 400 MG tablet Take 1,200 mg by mouth daily.    . metoprolol tartrate (LOPRESSOR) 25 MG tablet Take 25 mg by mouth 2 (two) times daily.    . predniSONE (DELTASONE) 5 MG tablet Take 5 mg by mouth daily with breakfast.    . sulfamethoxazole-trimethoprim (BACTRIM DS) 800-160 MG per tablet Take 1 tablet by mouth See admin instructions. Take one tablet on Monday, Wednesday, & Friday.    . tacrolimus (PROGRAF) 5 MG capsule Take 1 capsule by mouth 2 (two) times daily.    . tamsulosin (FLOMAX) 0.4 MG CAPS capsule      No current facility-administered medications for this visit.     REVIEW OF SYSTEMS:   [X]  denotes  positive finding, [ ]  denotes negative finding Cardiac  Comments:  Chest pain or chest pressure:    Shortness of breath upon exertion:    Short of breath when lying flat:    Irregular heart rhythm:        Vascular    Pain in calf, thigh, or hip brought on by ambulation:    Pain in feet at night that wakes you up from your sleep:     Blood clot in your veins:    Leg swelling:         Pulmonary    Oxygen at home:    Productive cough:     Wheezing:         Neurologic    Sudden weakness in arms or legs:     Sudden numbness in arms or legs:     Sudden onset of difficulty speaking or slurred speech:    Temporary loss of vision in one eye:     Problems with dizziness:         Gastrointestinal    Blood in stool:      Vomited blood:         Genitourinary    Burning when urinating:     Blood in urine:        Psychiatric    Major depression:         Hematologic    Bleeding problems:    Problems with blood clotting too easily:        Skin    Rashes or ulcers:        Constitutional    Fever or chills:     PHYSICAL EXAM:   Vitals:   05/20/18 1214  BP: (!) 143/100  Pulse: 74  Resp: 20  Temp: 98 F (36.7 C)  SpO2: 100%  Weight: 178 lb 5.6 oz (80.9 kg)  Height: 5\' 8"  (1.727 m)    GENERAL: The patient is a well-nourished male, in no acute distress. The vital signs are documented above. CARDIAC: There is a regular rate and rhythm.  VASCULAR: Pulsatility within cephalic venous tributaries in the forearm.  The fistula appears to be occluded in the upper arm.  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 PULMONARY: Nonlabored respirations ABDOMEN: Soft and non-tender with normal pitched bowel sounds.  MUSCULOSKELETAL: There are no major deformities or cyanosis. NEUROLOGIC: No focal weakness or paresthesias are detected. SKIN: There are no ulcers or rashes noted. PSYCHIATRIC: The patient has a normal affect.  STUDIES:   I have reviewed her vascular studies with the  following findings: +-----------------+-------------+----------+----------------------+ Left Basilic     Diameter (cm)Depth (cm)       Findings        +-----------------+-------------+----------+----------------------+ Mid upper arm        1.01                                      +-----------------+-------------+----------+----------------------+ Dist upper arm       1.01                                      +-----------------+-------------+----------+----------------------+ Antecubital fossa    0.70               medial branch 0.723 cm +-----------------+-------------+----------+----------------------+ Right: Patent brachial, radial, and ulnar arteries. Left: Patent brachial, radial, and ulnar arteries  ASSESSMENT and PLAN   The patient has an excellent left basilic vein.  I discussed proceeding with the basilic vein fistula.  I may elect to do this as a staged procedure because I feel that he needs to undergo ligation of his brachiocephalic fistula.  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  He has dialysis on Monday Wednesday Friday.  He is going to schedule this for Thursday in the near future.   Annamarie Major, MD [facility] and [facility]in Specialists of [facility] [phone] Pager [phone]

## 2018-05-29 ENCOUNTER — Encounter: Payer: Self-pay | Admitting: Internal Medicine

## 2018-06-10 ENCOUNTER — Telehealth: Payer: Self-pay | Admitting: *Deleted

## 2018-06-10 NOTE — Telephone Encounter (Signed)
Spoke with patient. Informed surgery deferred due to COVID 19 restrictions and will be rescheduled when restrictions are lifted. To work with the [facility] to notify this office for any worsening condition. Verbalized understanding.

## 2018-06-17 ENCOUNTER — Other Ambulatory Visit: Payer: Self-pay

## 2018-06-18 ENCOUNTER — Encounter: Payer: Self-pay | Admitting: Internal Medicine

## 2018-06-18 ENCOUNTER — Other Ambulatory Visit: Payer: Self-pay

## 2018-06-18 ENCOUNTER — Ambulatory Visit (INDEPENDENT_AMBULATORY_CARE_PROVIDER_SITE_OTHER): Payer: BC Managed Care – PPO | Admitting: Internal Medicine

## 2018-06-18 VITALS — BP 122/82 | HR 68 | Temp 97.7°F | Ht 68.0 in | Wt 184.4 lb

## 2018-06-18 DIAGNOSIS — E041 Nontoxic single thyroid nodule: Secondary | ICD-10-CM | POA: Diagnosis not present

## 2018-06-18 LAB — T4, FREE: Free T4: 0.86 ng/dL (ref 0.60–1.60)

## 2018-06-18 LAB — TSH: TSH: 0.46 u[IU]/mL (ref 0.35–4.50)

## 2018-06-18 NOTE — Patient Instructions (Addendum)
-   Please stop by the lab today to check your thyroid function - We will set you up for a dedicated thyroid ultrasound , but this will take a few weeks to schedule due to coronavirus.

## 2018-06-18 NOTE — Progress Notes (Signed)
Name: Charles [name]  MRN/ DOB: [id number], [birth year]    Age/ Sex: 43 y.o., male    PCP: Elmarie Shiley, MD   Reason for Endocrinology Evaluation: Thyroid nodule      Date of Initial Endocrinology Evaluation: 06/18/2018     HPI: [name] is a 43 y.o. male with a past medical history of ESRD Secondary to HTN (He is S/P renal transplant 2015)  The patient presented for initial endocrinology clinic visit on 06/18/2018 for consultative assistance with his thyroid nodule.   Pt was noted to have a left calcified thyroid nodule during dialysis catheter placement in 2019.  Pt did notice left neck swelling ~ 9 months ago  Pt denies any change in size since then, denies pain, dysphagia. No sob or voice hoarsness.   Denies prior exposure to radiation No kelp or biotin  No FH of thyroid disease.     HISTORY:  Past Medical History:  Past Medical History:  Diagnosis Date  . 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 Anemia of chronic kidney failure 07/30/2011  . Arteriovenous fistula ([place])   . Chronic kidney disease   . Chronic kidney disease (CKD), stage V ([place]) 05/03/2009   Qualifier: Diagnosis of  By: Al-Rammal, RVT, RDCS, Missy    . End-stage renal disease on hemodialysis ([place]) 08/09/2011  . History of renal transplant 11/27/2017  . Hyperparathyroidism ([place])   . Hypertension   . Metabolic bone disease   . Thrombocytopenia ([place]) 08/09/2011  . 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 AV FISTULA PLACEMENT  08/01/2011   Procedure: ARTERIOVENOUS (AV) FISTULA CREATION;  Surgeon: Elam Dutch, MD;  Location: [place];  Service: Vascular;  Laterality: Left;  . INSERTION OF DIALYSIS CATHETER  08/01/2011   Procedure: INSERTION OF DIALYSIS CATHETER;  Surgeon: Elam Dutch, MD;  Location: [facility];  Service: Vascular;  Laterality: Right;  Right Internal Jugular with 23 cm Dialysis Catheter  . NEPHRECTOMY TRANSPLANTED  ORGAN Right   . SHOULDER SURGERY Bilateral    20 years ago      Social History:  reports that he has never smoked. He has never used smokeless tobacco. He reports current alcohol use. He reports that he does not use drugs.  Family History: family history includes Cancer in his maternal grandmother; Diabetes in his mother; Hypertension in his father and mother.   HOME MEDICATIONS: Allergies as of 06/18/2018      Reactions   Verapamil Other (See Comments)   Pt not sure why we have this listed      Medication List       Accurate as of June 18, 2018  1:45 PM. Always use your most recent med list.        amLODipine 10 MG tablet Commonly known as:  NORVASC Take 10 mg by mouth daily.   calcium acetate 667 MG capsule Commonly known as:  PHOSLO TAKE 2 CAPSULES BY MOUTH THREE TIMES DAILY WITH MEALS AND 1 CAPSULE TWO TIMES DAILY WITH SNACKS   magnesium oxide 400 MG tablet Commonly known as:  MAG-OX Take 1,200 mg by mouth daily.   metoprolol tartrate 25 MG tablet Commonly known as:  LOPRESSOR Take 25 mg by mouth 2 (two) times daily.   predniSONE 5 MG tablet Commonly known as:  DELTASONE Take 5 mg by mouth daily with breakfast.   sulfamethoxazole-trimethoprim 800-160 MG per tablet Commonly known as:  BACTRIM DS  Take 1 tablet by mouth See admin instructions. Take one tablet on Monday, Wednesday, & Friday.   tacrolimus 5 MG capsule Commonly known as:  PROGRAF Take 1 capsule by mouth 2 (two) times daily.   tamsulosin 0.4 MG Caps capsule Commonly known as:  FLOMAX         REVIEW OF SYSTEMS: A comprehensive ROS was conducted with the patient and is negative except as per HPI and below:  Review of Systems  Constitutional: Negative for fever and weight loss.  HENT: Negative for congestion and sore throat.   Eyes: Negative for blurred vision and pain.  Respiratory: Negative for cough and shortness of breath.   Cardiovascular: Positive for palpitations. Negative for chest  pain.  Gastrointestinal: Negative for constipation and nausea.  Genitourinary: Negative for frequency.  Neurological: Negative for tingling and tremors.  Endo/Heme/Allergies: Positive for polydipsia.  Psychiatric/Behavioral: Negative for depression. The patient is not nervous/anxious.        OBJECTIVE:  VS: BP 122/82 (BP Location: Right Arm, Patient Position: Sitting, Cuff Size: Normal)   Pulse 68   Temp 97.7 F (36.5 C)   Ht 5\' 8"  (1.727 m)   Wt 184 lb 6.4 oz (83.6 kg)   SpO2 98%   BMI 28.04 kg/m    Wt Readings from Last 3 Encounters:  06/18/18 184 lb 6.4 oz (83.6 kg)  05/20/18 178 lb 5.6 oz (80.9 kg)  12/25/17 180 lb 6.4 oz (81.8 kg)     EXAM: General: Pt appears well and is in NAD  Hydration: Well-hydrated with moist mucous membranes and good skin turgor  Eyes: External eye exam normal without stare, lid lag or exophthalmos.  EOM intact.  PERRL.  Ears, Nose, Throat: Hearing: Grossly intact bilaterally Dental: Good dentition  Throat: Clear without mass, erythema or exudate  Neck: General: Supple without adenopathy. Thyroid: Thyroid size normal.  Left thyroid nodule  Appreciated ~ 4x3 cm. No thyroid bruit.  Lungs: Clear with good BS bilat with no rales, rhonchi, or wheezes  Heart: Auscultation: RRR.  Abdomen: Normoactive bowel sounds, soft, nontender, without masses or organomegaly palpable  Extremities:  BL LE: No pretibial edema normal ROM and strength.  Skin: Hair: Texture and amount normal with gender appropriate distribution Skin Inspection: No rashes. Skin Palpation: Skin temperature, texture, and thickness normal to palpation  Neuro: Cranial nerves: II - XII grossly intact  Motor: Normal strength throughout DTRs: 2+ and symmetric in UE without delay in relaxation phase  Mental Status: Judgment, insight: Intact Orientation: Oriented to time, place, and person Mood and affect: No depression, anxiety, or agitation     DATA REVIEWED: Results for KEDRIC, BUMGARNER (MRN [id number]) as of 06/18/2018 13:45  Ref. Range 06/18/2018 08:57  TSH Latest Ref Range: 0.35 - 4.50 uIU/mL 0.46  T4,Free(Direct) Latest Ref Range: 0.60 - 1.60 ng/dL 0.86     Old records , labs and images have been reviewed.   ASSESSMENT/PLAN/RECOMMENDATIONS:   1. Thyroid Nodule :   - Pt is clinically euthyroid  - TFT's today are  Normal  - No local neck symptoms.  - Will proceed with thyroid ultrasound and most likely will need to proceed with FNA of the left nodule , we discussed risk of malignancy ranges from 3-5 % depending on the nodule characteristic.    F/u 6 months   Signed electronically by: Mack Guise, MD  [facility]  [address] [place], [place] [address] Phone: [phone] FAX: [phone]  CC: Elmarie Shiley, MD [place] [address] Phone: [phone] Fax: [phone]   Return to Endocrinology clinic as below: Future Appointments  Date Time Provider [place]  12/17/2018  8:30 AM Linh Hedberg, Melanie Crazier, MD [id number] None

## 2018-06-20 ENCOUNTER — Ambulatory Visit (HOSPITAL_COMMUNITY): Admit: 2018-06-20 | Payer: BC Managed Care – PPO | Admitting: Surgery

## 2018-06-20 ENCOUNTER — Encounter (HOSPITAL_COMMUNITY): Payer: Self-pay

## 2018-06-20 SURGERY — LIGATION OF ARTERIOVENOUS  FISTULA
Anesthesia: Monitor Anesthesia Care | Laterality: Left

## 2018-07-24 ENCOUNTER — Other Ambulatory Visit: Payer: Self-pay

## 2018-07-26 ENCOUNTER — Other Ambulatory Visit: Payer: Self-pay | Admitting: *Deleted

## 2018-08-06 ENCOUNTER — Encounter (HOSPITAL_COMMUNITY): Payer: Self-pay | Admitting: *Deleted

## 2018-08-06 ENCOUNTER — Other Ambulatory Visit (HOSPITAL_COMMUNITY)
Admission: RE | Admit: 2018-08-06 | Discharge: 2018-08-06 | Disposition: A | Discharge status: Home / Self Care | Payer: BC Managed Care – PPO | Source: Ambulatory Visit | Attending: Surgery | Admitting: Surgery

## 2018-08-06 ENCOUNTER — Other Ambulatory Visit: Payer: Self-pay

## 2018-08-06 DIAGNOSIS — Z1159 Encounter for screening for other viral diseases: Secondary | ICD-10-CM | POA: Insufficient documentation

## 2018-08-06 NOTE — Progress Notes (Signed)
Pt denies SOB, chest pain, and being under the care of a cardiologist. Pt denies having a cardiac cath. Pt made aware to stop taking vitamins, fish oil and herbal medications. Do not take any NSAIDs ie: Ibuprofen, Advil, Naproxen (Aleve), Motrin, BC and Goody Powder.   Pt denies that he and family members tested positive for COVID-19.   Pt denies that he and family members experienced the following symptoms:  Cough yes/no: No Fever (>100.70F)  yes/no: No Runny nose yes/no: No Sore throat yes/no: No Difficulty breathing/shortness of breath  yes/no: No  Have you or a family member traveled in the last 14 days and where? yes/no: No  Pt reminded that hospital visitation restrictions are in effect and the importance of the restrictions.   Pt verbalized understanding of all pre-op instructions.

## 2018-08-07 LAB — NOVEL CORONAVIRUS, NAA (HOSP ORDER, SEND-OUT TO REF LAB; TAT 18-24 HRS): SARS-CoV-2, NAA: NOT DETECTED

## 2018-08-07 NOTE — Anesthesia Preprocedure Evaluation (Addendum)
Anesthesia Evaluation  Patient identified by MRN, date of birth, ID band Patient awake    Reviewed: Allergy & Precautions, NPO status , Patient's Chart, lab work & pertinent test results  History of Anesthesia Complications Negative for: history of anesthetic complications  Airway Mallampati: III  TM Distance: >3 FB Neck ROM: Full    Dental  (+) Dental Advisory Given, Teeth Intact   Pulmonary neg pulmonary ROS,    breath sounds clear to auscultation       Cardiovascular hypertension, Pt. on medications and Pt. on home beta blockers (-) angina Rhythm:Regular Rate:Normal   '20 TTE (Care Everywhere) - Moderate concentric left ventricular hypertrophy. EF 55-60%. LA is mildly dilated. Mild TR and PR.     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      Musculoskeletal negative musculoskeletal ROS (+)   Abdominal   Peds  Hematology  (+) anemia ,   Anesthesia Other Findings   Reproductive/Obstetrics                            Anesthesia Physical Anesthesia Plan  ASA: III  Anesthesia Plan: General   Post-op Pain Management:    Induction: Intravenous  PONV Risk Score and Plan: 2 and Treatment may vary due to age or medical condition, Ondansetron and Midazolam  Airway Management Planned: LMA  Additional Equipment: None  Intra-op Plan:   Post-operative Plan: Extubation in OR  Informed Consent: I have reviewed the patients History and Physical, chart, labs and discussed the procedure including the risks, benefits and alternatives for the proposed anesthesia with the patient or authorized representative who has indicated his/her understanding and acceptance.     Dental advisory given  Plan Discussed with: CRNA and  Anesthesiologist  Anesthesia Plan Comments:        Anesthesia Quick Evaluation

## 2018-08-08 ENCOUNTER — Ambulatory Visit (HOSPITAL_COMMUNITY): Payer: BC Managed Care – PPO | Admitting: Anesthesiology

## 2018-08-08 ENCOUNTER — Encounter (HOSPITAL_COMMUNITY)
Admission: RE | Disposition: A | Discharge status: Home / Self Care | Payer: Self-pay | Source: Home / Self Care | Attending: Surgery

## 2018-08-08 ENCOUNTER — Other Ambulatory Visit: Payer: Self-pay

## 2018-08-08 ENCOUNTER — Ambulatory Visit (HOSPITAL_COMMUNITY)
Admission: RE | Admit: 2018-08-08 | Discharge: 2018-08-08 | Disposition: A | Discharge status: Home / Self Care | Payer: BC Managed Care – PPO | Attending: Surgery | Admitting: Surgery

## 2018-08-08 ENCOUNTER — Encounter (HOSPITAL_COMMUNITY): Payer: Self-pay

## 2018-08-08 DIAGNOSIS — T82898A Other specified complication of vascular prosthetic devices, implants and grafts, initial encounter: Secondary | ICD-10-CM | POA: Diagnosis not present

## 2018-08-08 DIAGNOSIS — Y832 Surgical operation with anastomosis, bypass or graft as the cause of abnormal reaction of the patient, or of later complication, without mention of misadventure at the time of the procedure: Secondary | ICD-10-CM | POA: Insufficient documentation

## 2018-08-08 DIAGNOSIS — D696 Thrombocytopenia, unspecified: Secondary | ICD-10-CM | POA: Insufficient documentation

## 2018-08-08 DIAGNOSIS — I120 Hypertensive chronic kidney disease with stage 5 chronic kidney disease or end stage renal disease: Secondary | ICD-10-CM | POA: Diagnosis not present

## 2018-08-08 DIAGNOSIS — Z7952 Long term (current) use of systemic steroids: Secondary | ICD-10-CM | POA: Diagnosis not present

## 2018-08-08 DIAGNOSIS — D631 Anemia in chronic kidney disease: Secondary | ICD-10-CM | POA: Insufficient documentation

## 2018-08-08 DIAGNOSIS — Z79899 Other long term (current) drug therapy: Secondary | ICD-10-CM | POA: Insufficient documentation

## 2018-08-08 DIAGNOSIS — Z992 Dependence on renal dialysis: Secondary | ICD-10-CM | POA: Insufficient documentation

## 2018-08-08 DIAGNOSIS — N186 End stage renal disease: Secondary | ICD-10-CM | POA: Diagnosis present

## 2018-08-08 DIAGNOSIS — N185 Chronic kidney disease, stage 5: Secondary | ICD-10-CM | POA: Diagnosis not present

## 2018-08-08 DIAGNOSIS — Z940 Kidney transplant status: Secondary | ICD-10-CM | POA: Insufficient documentation

## 2018-08-08 DIAGNOSIS — Z792 Long term (current) use of antibiotics: Secondary | ICD-10-CM | POA: Diagnosis not present

## 2018-08-08 HISTORY — PX: REVISON OF ARTERIOVENOUS FISTULA: SHX6074

## 2018-08-08 HISTORY — PX: LIGATION OF ARTERIOVENOUS  FISTULA: SHX5948

## 2018-08-08 HISTORY — DX: Presence of spectacles and contact lenses: Z97.3

## 2018-08-08 LAB — POCT I-STAT 4, (NA,K, GLUC, HGB,HCT)
Glucose, Bld: 82 mg/dL (ref 70–99)
HCT: 37 % — ABNORMAL LOW (ref 39.0–52.0)
Hemoglobin: 12.6 g/dL — ABNORMAL LOW (ref 13.0–17.0)
Potassium: 5.5 mmol/L — ABNORMAL HIGH (ref 3.5–5.1)
Sodium: 135 mmol/L (ref 135–145)

## 2018-08-08 SURGERY — REVISON OF ARTERIOVENOUS FISTULA
Anesthesia: General | Site: Arm Upper | Laterality: Left

## 2018-08-08 MED ORDER — HYDROCODONE-ACETAMINOPHEN 5-325 MG PO TABS: 1.0000 | ORAL_TABLET | Freq: Four times a day (QID) | ORAL | 0 refills | Status: DC | PRN

## 2018-08-08 MED ORDER — MIDAZOLAM HCL 5 MG/5ML IJ SOLN
INTRAMUSCULAR | Status: DC | PRN
  Administered 2018-08-08: 2 mg via INTRAVENOUS

## 2018-08-08 MED ORDER — PROTAMINE SULFATE 10 MG/ML IV SOLN
INTRAVENOUS | Status: DC | PRN
  Administered 2018-08-08: 10 mg via INTRAVENOUS
  Administered 2018-08-08: 15 mg via INTRAVENOUS

## 2018-08-08 MED ORDER — ONDANSETRON HCL 4 MG/2ML IJ SOLN
INTRAMUSCULAR | Status: AC
  Filled 2018-08-08: qty 2

## 2018-08-08 MED ORDER — HEPARIN SODIUM (PORCINE) 1000 UNIT/ML IJ SOLN
INTRAMUSCULAR | Status: AC
  Filled 2018-08-08: qty 1

## 2018-08-08 MED ORDER — SODIUM CHLORIDE 0.9 % IV SOLN
INTRAVENOUS | Status: DC | PRN
  Administered 2018-08-08: 500 mL

## 2018-08-08 MED ORDER — MIDAZOLAM HCL 2 MG/2ML IJ SOLN
INTRAMUSCULAR | Status: AC
  Filled 2018-08-08: qty 2

## 2018-08-08 MED ORDER — PROPOFOL 10 MG/ML IV BOLUS
INTRAVENOUS | Status: AC
  Filled 2018-08-08: qty 20

## 2018-08-08 MED ORDER — FENTANYL CITRATE (PF) 250 MCG/5ML IJ SOLN
INTRAMUSCULAR | Status: AC
  Filled 2018-08-08: qty 5

## 2018-08-08 MED ORDER — HEMOSTATIC AGENTS (NO CHARGE) OPTIME
TOPICAL | Status: DC | PRN
  Administered 2018-08-08: 1 via TOPICAL

## 2018-08-08 MED ORDER — FENTANYL CITRATE (PF) 250 MCG/5ML IJ SOLN
INTRAMUSCULAR | Status: DC | PRN
  Administered 2018-08-08: 50 ug via INTRAVENOUS
  Administered 2018-08-08 (×2): 100 ug via INTRAVENOUS

## 2018-08-08 MED ORDER — LIDOCAINE HCL (CARDIAC) PF 100 MG/5ML IV SOSY
PREFILLED_SYRINGE | INTRAVENOUS | Status: DC | PRN
  Administered 2018-08-08: 60 mg via INTRAVENOUS

## 2018-08-08 MED ORDER — PROPOFOL 10 MG/ML IV BOLUS
INTRAVENOUS | Status: DC | PRN
  Administered 2018-08-08: 140 mg via INTRAVENOUS

## 2018-08-08 MED ORDER — SODIUM CHLORIDE 0.9 % IV SOLN
INTRAVENOUS | Status: DC | PRN
  Administered 2018-08-08: 08:00:00 50 ug/min via INTRAVENOUS

## 2018-08-08 MED ORDER — LIDOCAINE-EPINEPHRINE (PF) 1 %-1:200000 IJ SOLN
INTRAMUSCULAR | Status: DC | PRN
  Administered 2018-08-08: 5 mL

## 2018-08-08 MED ORDER — LIDOCAINE 2% (20 MG/ML) 5 ML SYRINGE
INTRAMUSCULAR | Status: AC
  Filled 2018-08-08: qty 5

## 2018-08-08 MED ORDER — EPHEDRINE SULFATE 50 MG/ML IJ SOLN
INTRAMUSCULAR | Status: DC | PRN
  Administered 2018-08-08 (×3): 5 mg via INTRAVENOUS

## 2018-08-08 MED ORDER — PROTAMINE SULFATE 10 MG/ML IV SOLN
INTRAVENOUS | Status: AC
  Filled 2018-08-08: qty 5

## 2018-08-08 MED ORDER — PHENYLEPHRINE HCL (PRESSORS) 10 MG/ML IV SOLN
INTRAVENOUS | Status: DC | PRN
  Administered 2018-08-08 (×3): 80 ug via INTRAVENOUS

## 2018-08-08 MED ORDER — SODIUM CHLORIDE 0.9 % IV SOLN
INTRAVENOUS | Status: AC
  Filled 2018-08-08: qty 1.2

## 2018-08-08 MED ORDER — LIDOCAINE-EPINEPHRINE (PF) 1 %-1:200000 IJ SOLN
INTRAMUSCULAR | Status: AC
  Filled 2018-08-08: qty 30

## 2018-08-08 MED ORDER — CEFAZOLIN SODIUM-DEXTROSE 2-4 GM/100ML-% IV SOLN
2.0000 g | INTRAVENOUS | Status: AC
  Administered 2018-08-08: 08:00:00 2 g via INTRAVENOUS
  Filled 2018-08-08: qty 100

## 2018-08-08 MED ORDER — SODIUM CHLORIDE 0.9 % IV SOLN
INTRAVENOUS | Status: DC
  Administered 2018-08-08 (×2): via INTRAVENOUS

## 2018-08-08 MED ORDER — HEPARIN SODIUM (PORCINE) 1000 UNIT/ML IJ SOLN
INTRAMUSCULAR | Status: DC | PRN
  Administered 2018-08-08: 3000 [IU] via INTRAVENOUS

## 2018-08-08 MED ORDER — 0.9 % SODIUM CHLORIDE (POUR BTL) OPTIME
TOPICAL | Status: DC | PRN
  Administered 2018-08-08: 1000 mL

## 2018-08-08 MED ORDER — ONDANSETRON HCL 4 MG/2ML IJ SOLN
INTRAMUSCULAR | Status: DC | PRN
  Administered 2018-08-08: 4 mg via INTRAVENOUS

## 2018-08-08 SURGICAL SUPPLY — 38 items
ADH SKN CLS APL DERMABOND .7 (GAUZE/BANDAGES/DRESSINGS) ×1
ARMBAND PINK RESTRICT EXTREMIT (MISCELLANEOUS) ×2 IMPLANT
CANISTER SUCT 3000ML PPV (MISCELLANEOUS) ×2 IMPLANT
CLIP VESOCCLUDE MED 6/CT (CLIP) ×2 IMPLANT
CLIP VESOCCLUDE SM WIDE 6/CT (CLIP) ×3 IMPLANT
COVER PROBE W GEL 5X96 (DRAPES) ×1 IMPLANT
COVER WAND RF STERILE (DRAPES) ×2 IMPLANT
DERMABOND ADVANCED (GAUZE/BANDAGES/DRESSINGS) ×1
DERMABOND ADVANCED .7 DNX12 (GAUZE/BANDAGES/DRESSINGS) ×1 IMPLANT
ELECT REM PT RETURN 9FT ADLT (ELECTROSURGICAL) ×2
ELECTRODE REM PT RTRN 9FT ADLT (ELECTROSURGICAL) ×1 IMPLANT
GLOVE BIO SURGEON STRL SZ7.5 (GLOVE) ×1 IMPLANT
GLOVE BIOGEL PI IND STRL 6.5 (GLOVE) IMPLANT
GLOVE BIOGEL PI IND STRL 7.5 (GLOVE) ×1 IMPLANT
GLOVE BIOGEL PI INDICATOR 6.5 (GLOVE) ×1
GLOVE BIOGEL PI INDICATOR 7.5 (GLOVE) ×1
GLOVE INDICATOR 7.0 STRL GRN (GLOVE) ×2 IMPLANT
GLOVE SURG SS PI 6.5 STRL IVOR (GLOVE) ×1 IMPLANT
GLOVE SURG SS PI 7.5 STRL IVOR (GLOVE) ×2 IMPLANT
GOWN STRL REUS W/ TWL LRG LVL3 (GOWN DISPOSABLE) ×2 IMPLANT
GOWN STRL REUS W/ TWL XL LVL3 (GOWN DISPOSABLE) ×1 IMPLANT
GOWN STRL REUS W/TWL LRG LVL3 (GOWN DISPOSABLE) ×4
GOWN STRL REUS W/TWL XL LVL3 (GOWN DISPOSABLE) ×4
HEMOSTAT SNOW SURGICEL 2X4 (HEMOSTASIS) ×1 IMPLANT
KIT BASIN OR (CUSTOM PROCEDURE TRAY) ×2 IMPLANT
KIT TURNOVER KIT B (KITS) ×2 IMPLANT
LOOP VESSEL MAXI BLUE (MISCELLANEOUS) ×1 IMPLANT
NS IRRIG 1000ML POUR BTL (IV SOLUTION) ×2 IMPLANT
PACK CV ACCESS (CUSTOM PROCEDURE TRAY) ×2 IMPLANT
PAD ARMBOARD 7.5X6 YLW CONV (MISCELLANEOUS) ×4 IMPLANT
SUT PROLENE 5 0 C 1 24 (SUTURE) ×4 IMPLANT
SUT PROLENE 6 0 CC (SUTURE) ×3 IMPLANT
SUT VIC AB 3-0 SH 27 (SUTURE) ×2
SUT VIC AB 3-0 SH 27X BRD (SUTURE) ×1 IMPLANT
SUT VICRYL 4-0 PS2 18IN ABS (SUTURE) ×1 IMPLANT
TOWEL GREEN STERILE (TOWEL DISPOSABLE) ×2 IMPLANT
UNDERPAD 30X30 (UNDERPADS AND DIAPERS) ×2 IMPLANT
WATER STERILE IRR 1000ML POUR (IV SOLUTION) ×2 IMPLANT

## 2018-08-08 NOTE — Discharge Instructions (Signed)
° °  [facility] [place] ° °Discharge Instructions ° °AV Fistula or Graft Surgery for Dialysis Access ° °Please refer to the following instructions for your post-procedure care. Your surgeon or physician assistant will discuss any changes with you. ° °Activity ° °You may drive the day following your surgery, if you are comfortable and no longer taking prescription pain medication. Resume full activity as the soreness in your incision resolves. ° °Bathing/Showering ° °You may shower after you go home. Keep your incision dry for 48 hours. Do not soak in a bathtub, hot tub, or swim until the incision heals completely. You may not shower if you have a hemodialysis catheter. ° °Incision Care ° °Clean your incision with mild soap and water after 48 hours. Pat the area dry with a clean towel. You do not need a bandage unless otherwise instructed. Do not apply any ointments or creams to your incision. You may have skin glue on your incision. Do not peel it off. It will come off on its own in about one week. Your arm may swell a bit after surgery. To reduce swelling use pillows to elevate your arm so it is above your heart. Your doctor will tell you if you need to lightly wrap your arm with an ACE bandage. ° °Diet ° °Resume your normal diet. There are not special food restrictions following this procedure. In order to heal from your surgery, it is CRITICAL to get adequate nutrition. Your body requires vitamins, minerals, and protein. Vegetables are the best source of vitamins and minerals. Vegetables also provide the perfect balance of protein. Processed food has little nutritional value, so try to avoid this. ° °Medications ° °Resume taking all of your medications. If your incision is causing pain, you may take over-the counter pain relievers such as acetaminophen (Tylenol). If you were prescribed a stronger pain medication, please be aware these medications can cause nausea and constipation. Prevent  nausea by taking the medication with a snack or meal. Avoid constipation by drinking plenty of fluids and eating foods with high amount of fiber, such as fruits, vegetables, and grains. Do not take Tylenol if you are taking prescription pain medications. ° ° ° ° °Follow up °Your surgeon may want to see you in the office following your access surgery. If so, this will be arranged at the time of your surgery. ° °Please call us immediately for any of the following conditions: ° °Increased pain, redness, drainage (pus) from your incision site °Fever of 101 degrees or higher °Severe or worsening pain at your incision site °Hand pain or numbness. ° °Reduce your risk of vascular disease: ° °Stop smoking. If you would like help, call [facility] at 1-800-QUIT-NOW (1-800-784-8669) or [place] at 336-586-4000 ° °Manage your cholesterol °Maintain a desired weight °Control your diabetes °Keep your blood pressure down ° °Dialysis ° °It will take several weeks to several months for your new dialysis access to be ready for use. Your surgeon will determine when it is OK to use it. Your nephrologist will continue to direct your dialysis. You can continue to use your Permcath until your new access is ready for use. ° °If you have any questions, please call the office at [phone]. ° °

## 2018-08-08 NOTE — Anesthesia Procedure Notes (Signed)
Procedure Name: LMA Insertion Date/Time: 08/08/2018 8:11 AM Performed by: Mariea Clonts, CRNA Pre-anesthesia Checklist: Patient identified, Emergency Drugs available, Suction available and Patient being monitored Patient Re-evaluated:Patient Re-evaluated prior to induction Oxygen Delivery Method: Circle System Utilized Preoxygenation: Pre-oxygenation with 100% oxygen Induction Type: IV induction Ventilation: Mask ventilation without difficulty LMA: LMA inserted LMA Size: 5.0 Number of attempts: 1 Airway Equipment and Method: Bite block Placement Confirmation: positive ETCO2 Tube secured with: Tape Dental Injury: Teeth and Oropharynx as per pre-operative assessment

## 2018-08-08 NOTE — Op Note (Signed)
° ° °  Patient name: Charles [name] MRN: [id number] DOB: 12-09-1975 Sex: male  08/08/2018 Pre-operative Diagnosis: ESRD Post-operative diagnosis:  Same Surgeon:  Annamarie Major Assistants:  Arlee Muslim Procedure:   #1: Ligation of left brachiocephalic fistula   #2: For stage left basilic vein fistula  Anesthesia: General Blood Loss: Minimal Specimens: None  Findings: Excellent left basilic vein.  I ligated aneurysmal brachiocephalic fistula.  The basilic vein was sewed to a cuff of the residual cephalic vein fistula on the brachial artery.  Indications: Patient comes in needing new access.  He has a large aneurysmal brachiocephalic fistula that has occluded but still has pulsatility proximally.  For that reason I elected to stage his basilic vein so that hopefully some of the fullness in his upper arm can resolve before the second stage.  Procedure:  The patient was identified in the holding area and taken to [facility]  The patient was then placed supine on the table. general anesthesia was administered.  The patient was prepped and draped in the usual sterile fashion.  A time out was called and antibiotics were administered.  Ultrasound was used to evaluate the basilic vein in the upper arm.  It measured at least 8 mm throughout.  Next, 1% like it was used local anesthesia.  A transverse incision was made antecubital crease.  I first dissected out the proximal portion of the previous cephalic vein fistula which remained pulsatile.  This was encircled.  I then isolated the brachial artery proximal and distal to the anastomosis.  Once this was done, attention was turned towards the basilic vein.  The basilic vein was harvested within the incision.  Side branches were ligated.  The nerve was protected.  The vein measured 8 mm.  Next, the patient was given 3000 units of heparin.  The basilic vein was marked for orientation and ligated distally with a 2-0 silk tie.  It distended nicely with heparin  saline.  It was reoccluded with a Serafin clamp.  I then occluded the brachial artery as well as the cephalic vein fistula.  The cephalic vein fistula was then opened with an 11 blade and transected with Metzenbaum scissors.  I removed as much of the vein as I could going up the arm.  I then oversewed the remnant with 2 layers of 5-0 Prolene.  I then resected the remaining portion of the aneurysmal fistula.  I did leave a cuff of the vein on the brachial artery.  I then spatulated the basilic vein and performed a anastomosis to the brachial artery which was actually a cuff of the residual brachiocephalic fistula.  This was done with running 5-0 Prolene.  Once this was done the clamps were released.  There was excellent thrill within the basilic vein.  There is a palpable radial pulse.  25 mg of protamine was given.  Hemostasis was achieved and the incision was closed 2 layers with 3-0 Vicryl followed by Dermabond.  There are no immediate complications.   Disposition: To PACU stable.   Theotis Burrow, M.D., [facility] [place] Office: [phone] Pager:  [phone]

## 2018-08-08 NOTE — H&P (Signed)
[facility]  Patient name: Charles [name]            MRN: [id number]        DOB: 19-Aug-1975          Sex: male   REQUESTING PROVIDER:    Dr. Posey Pronto   REASON FOR CONSULT:    Renal access  HISTORY OF PRESENT ILLNESS:   Charles [name] is a 43 y.o. male, who is referred for evaluation of dialysis access.  He most recently in 2013 had a left brachiocephalic fistula placed.  Patient renal failure secondary to hypertension.  He has a history of failed kidney transplant.  He is a non-smoker.  PAST MEDICAL HISTORY    Past Medical History:  Diagnosis Date  . 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 Anemia of chronic kidney failure 07/30/2011  . Arteriovenous fistula ([place])   . Chronic kidney disease   . Chronic kidney disease (CKD), stage V ([place]) 05/03/2009   Qualifier: Diagnosis of  By: Al-Rammal, RVT, RDCS, Missy    . End-stage renal disease on hemodialysis ([place]) 08/09/2011  . History of renal transplant 11/27/2017  . Hyperparathyroidism ([place])   . Hypertension   . Metabolic bone disease   . Thrombocytopenia ([place]) 08/09/2011  . 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 Hypertension Mother   . Diabetes Mother   . Hypertension Father   . Cancer Maternal Grandmother        type unknown    SOCIAL HISTORY:   Social History        Socioeconomic History  . Marital status: Single    Spouse name: Not on file  . Number of children: 4  . Years of education: Not on file  . Highest education level: Not on file  Occupational History  . Occupation: Administrator, sports  Social Needs  . Financial resource strain: Not on file  . Food insecurity:    Worry: Not on file    Inability: Not on file  . Transportation needs:    Medical: Not on file    Non-medical: Not on file  Tobacco Use  . Smoking status: Never Smoker  .  Smokeless tobacco: Never Used  Substance and Sexual Activity  . Alcohol use: Yes    Comment: social  . Drug use: No  . Sexual activity: Not on file  Lifestyle  . Physical activity:    Days per week: Not on file    Minutes per session: Not on file  . Stress: Not on file  Relationships  . Social connections:    Talks on phone: Not on file    Gets together: Not on file    Attends religious service: Not on file    Active member of club or organization: Not on file    Attends meetings of clubs or organizations: Not on file    Relationship status: Not on file  . Intimate partner violence:    Fear of current or ex partner: Not on file    Emotionally abused: Not on file    Physically abused: Not on file    Forced sexual activity: Not on file  Other Topics Concern  . Not on file  Social History Narrative   Works for [employer] for [employer].   Lives in [place] with wife and 4 kids.    ALLERGIES:  Allergies  Allergen Reactions  . Verapamil Other (See Comments)    Pt not sure why we have this listed    CURRENT MEDICATIONS:          Current Outpatient Medications  Medication Sig Dispense Refill  . amLODipine (NORVASC) 10 MG tablet Take 10 mg by mouth daily.    . calcium acetate (PHOSLO) 667 MG capsule TAKE 2 CAPSULES BY MOUTH THREE TIMES DAILY WITH MEALS AND 1 CAPSULE TWO TIMES DAILY WITH SNACKS    . magnesium oxide (MAG-OX) 400 MG tablet Take 1,200 mg by mouth daily.    . metoprolol tartrate (LOPRESSOR) 25 MG tablet Take 25 mg by mouth 2 (two) times daily.    . predniSONE (DELTASONE) 5 MG tablet Take 5 mg by mouth daily with breakfast.    . sulfamethoxazole-trimethoprim (BACTRIM DS) 800-160 MG per tablet Take 1 tablet by mouth See admin instructions. Take one tablet on Monday, Wednesday, & Friday.    . tacrolimus (PROGRAF) 5 MG capsule Take 1 capsule by mouth 2 (two) times daily.    . tamsulosin  (FLOMAX) 0.4 MG CAPS capsule      No current facility-administered medications for this visit.     REVIEW OF SYSTEMS:   [X]  denotes positive finding, [ ]  denotes negative finding Cardiac  Comments:  Chest pain or chest pressure:    Shortness of breath upon exertion:    Short of breath when lying flat:    Irregular heart rhythm:        Vascular    Pain in calf, thigh, or hip brought on by ambulation:    Pain in feet at night that wakes you up from your sleep:     Blood clot in your veins:    Leg swelling:         Pulmonary    Oxygen at home:    Productive cough:     Wheezing:         Neurologic    Sudden weakness in arms or legs:     Sudden numbness in arms or legs:     Sudden onset of difficulty speaking or slurred speech:    Temporary loss of vision in one eye:     Problems with dizziness:         Gastrointestinal    Blood in stool:      Vomited blood:         Genitourinary    Burning when urinating:     Blood in urine:        Psychiatric    Major depression:         Hematologic    Bleeding problems:    Problems with blood clotting too easily:        Skin    Rashes or ulcers:        Constitutional    Fever or chills:     PHYSICAL EXAM:      Vitals:   05/20/18 1214  BP: (!) 143/100  Pulse: 74  Resp: 20  Temp: 98 F (36.7 C)  SpO2: 100%  Weight: 178 lb 5.6 oz (80.9 kg)  Height: 5\' 8"  (1.727 m)    GENERAL: The patient is a well-nourished male, in no acute distress. The vital signs are documented above. CARDIAC: There is a regular rate and rhythm.  VASCULAR: Pulsatility within cephalic venous tributaries in the forearm.  The fistula appears to be occluded in the upper arm.  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 PULMONARY:  Nonlabored respirations ABDOMEN: Soft and non-tender with normal pitched bowel sounds.  MUSCULOSKELETAL: There are no  major deformities or cyanosis. NEUROLOGIC: No focal weakness or paresthesias are detected. SKIN: There are no ulcers or rashes noted. PSYCHIATRIC: The patient has a normal affect.  STUDIES:   I have reviewed her vascular studies with the following findings: +-----------------+-------------+----------+----------------------+ Left Basilic Diameter (cm)Depth (cm) Findings  +-----------------+-------------+----------+----------------------+ Mid upper arm  1.01    +-----------------+-------------+----------+----------------------+ Dist upper arm  1.01    +-----------------+-------------+----------+----------------------+ Antecubital fossa 0.70  medial branch 0.723 cm +-----------------+-------------+----------+----------------------+ Right: Patent brachial, radial, and ulnar arteries. Left: Patent brachial, radial, and ulnar arteries  ASSESSMENT and PLAN   The patient has an excellent left basilic vein.  I discussed proceeding with the basilic vein fistula.  I may elect to do this as a staged procedure because I feel that he needs to undergo ligation of his brachiocephalic fistula.  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  He has dialysis on Monday Wednesday Friday.  He is going to schedule this for Thursday in the near future.   Annamarie Major, MD [facility] [phone] Pager [phone]

## 2018-08-08 NOTE — Anesthesia Postprocedure Evaluation (Signed)
Anesthesia Post Note  Patient: Charles [name]  Procedure(s) Performed: CREATION OF BRACHIOBASILIC FISTULA (Left Arm Upper) LIGATION OF BACHIOCEPHALIC FISTULA (Left Arm Upper)     Patient location during evaluation: PACU Anesthesia Type: General Level of consciousness: awake and alert Pain management: pain level controlled Vital Signs Assessment: post-procedure vital signs reviewed and stable Respiratory status: spontaneous breathing, nonlabored ventilation and respiratory function stable Cardiovascular status: blood pressure returned to baseline and stable Postop Assessment: no apparent nausea or vomiting Anesthetic complications: no    Last Vitals:  Vitals:   08/08/18 1100 08/08/18 1115  BP: 119/79 116/77  Pulse: 77 71  Resp: 11 18  Temp:  37 C  SpO2: 99% 95%    Last Pain:  Vitals:   08/08/18 1115  TempSrc:   PainSc: 0-No pain                 Audry Pili

## 2018-08-08 NOTE — Transfer of Care (Signed)
Immediate Anesthesia Transfer of Care Note  Patient: Charles [name]  Procedure(s) Performed: CREATION OF BRACHIOBASILIC FISTULA (Left Arm Upper) LIGATION OF BACHIOCEPHALIC FISTULA (Left Arm Upper)  Patient Location: PACU  Anesthesia Type:General  Level of Consciousness: awake, alert  and oriented  Airway & Oxygen Therapy: Patient Spontanous Breathing  Post-op Assessment: Report given to RN, Post -op Vital signs reviewed and stable and Patient moving all extremities X 4  Post vital signs: Reviewed and stable  Last Vitals:  Vitals Value Taken Time  BP 130/77 08/08/2018 10:13 AM  Temp    Pulse 88 08/08/2018 10:14 AM  Resp 19 08/08/2018 10:14 AM  SpO2 100 % 08/08/2018 10:14 AM  Vitals shown include unvalidated device data.  Last Pain:  Vitals:   08/08/18 0605  TempSrc:   PainSc: 0-No pain      Patients Stated Pain Goal: 0 (45/91/36 [id number])  Complications: No apparent anesthesia complications

## 2018-08-09 ENCOUNTER — Other Ambulatory Visit: Payer: BC Managed Care – PPO

## 2018-08-09 ENCOUNTER — Encounter (HOSPITAL_COMMUNITY): Payer: Self-pay | Admitting: Surgery

## 2018-08-22 ENCOUNTER — Other Ambulatory Visit: Payer: BC Managed Care – PPO

## 2018-09-17 ENCOUNTER — Other Ambulatory Visit: Payer: Self-pay

## 2018-09-17 DIAGNOSIS — N186 End stage renal disease: Secondary | ICD-10-CM

## 2018-09-17 DIAGNOSIS — Z992 Dependence on renal dialysis: Secondary | ICD-10-CM

## 2018-09-19 ENCOUNTER — Telehealth (HOSPITAL_COMMUNITY): Payer: Self-pay | Admitting: Rehabilitation

## 2018-09-19 NOTE — Telephone Encounter (Signed)

## 2018-09-23 ENCOUNTER — Ambulatory Visit (INDEPENDENT_AMBULATORY_CARE_PROVIDER_SITE_OTHER): Payer: Self-pay | Admitting: Family

## 2018-09-23 ENCOUNTER — Other Ambulatory Visit: Payer: Self-pay

## 2018-09-23 ENCOUNTER — Telehealth: Payer: Self-pay | Admitting: *Deleted

## 2018-09-23 ENCOUNTER — Encounter: Payer: Self-pay | Admitting: Family

## 2018-09-23 ENCOUNTER — Other Ambulatory Visit: Payer: Self-pay | Admitting: *Deleted

## 2018-09-23 ENCOUNTER — Ambulatory Visit (HOSPITAL_COMMUNITY)
Admission: RE | Admit: 2018-09-23 | Discharge: 2018-09-23 | Disposition: A | Discharge status: Home / Self Care | Payer: BC Managed Care – PPO | Source: Ambulatory Visit | Attending: Surgery | Admitting: Surgery

## 2018-09-23 ENCOUNTER — Ambulatory Visit (HOSPITAL_COMMUNITY): Payer: BC Managed Care – PPO

## 2018-09-23 ENCOUNTER — Encounter: Payer: Self-pay | Admitting: *Deleted

## 2018-09-23 ENCOUNTER — Encounter: Payer: BC Managed Care – PPO | Admitting: Family

## 2018-09-23 VITALS — BP 129/89 | HR 86 | Temp 98.0°F | Resp 18 | Ht 69.0 in | Wt 184.0 lb

## 2018-09-23 DIAGNOSIS — N186 End stage renal disease: Secondary | ICD-10-CM

## 2018-09-23 DIAGNOSIS — Z992 Dependence on renal dialysis: Secondary | ICD-10-CM | POA: Insufficient documentation

## 2018-09-23 DIAGNOSIS — I770 Arteriovenous fistula, acquired: Secondary | ICD-10-CM

## 2018-09-23 NOTE — Progress Notes (Signed)
CC: 6 weeks follow up AVF duplex  History of Present Illness  Charles [name] is a 43 y.o. ([birth year]) male who is s/p ligation of left brachiocephalic fistula and first stage left basilic vein fistula creation on 08-08-18 by Dr. Trula Slade.  Pt had a large aneurysmal brachiocephalic fistula that had occluded but still had pulsatility proximally.  For that reason Dr. Trula Slade elected to stage his basilic vein so that hopefully some of the fullness in his upper arm could resolve before the second stage.   He returns today for 6 weeks AVF duplex.  He dialyzes MWF via right IJ TDC.   He denies steal type symptoms in his left upper extremity.     Past Medical History:  Diagnosis Date  . 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 Anemia of chronic kidney failure 07/30/2011  . Arteriovenous fistula ([place])   . Chronic kidney disease   . Chronic kidney disease (CKD), stage V ([place]) 05/03/2009   Qualifier: Diagnosis of  By: Al-Rammal, RVT, RDCS, Missy    . End-stage renal disease on hemodialysis ([place]) 08/09/2011  . History of renal transplant 11/27/2017  . Hyperparathyroidism ([place])   . Hypertension   . Metabolic bone disease   . Thrombocytopenia ([place]) 08/09/2011  . 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 Wears contact lenses     Social History Social History   Tobacco Use  . Smoking status: Never Smoker  . Smokeless tobacco: Never Used  Substance Use Topics  . Alcohol use: Not Currently    Comment: social  . Drug use: No    Family History Family History  Problem Relation Age of Onset  . Hypertension Mother   . Diabetes Mother   . Hypertension Father   . Cancer Maternal Grandmother        type unknown    Surgical History Past Surgical History:  Procedure Laterality Date  . AV FISTULA PLACEMENT  08/01/2011   Procedure: ARTERIOVENOUS (AV) FISTULA CREATION;  Surgeon: Elam Dutch, MD;  Location: [place];  Service: Vascular;  Laterality: Left;  . INSERTION OF DIALYSIS CATHETER   08/01/2011   Procedure: INSERTION OF DIALYSIS CATHETER;  Surgeon: Elam Dutch, MD;  Location: [place];  Service: Vascular;  Laterality: Right;  Right Internal Jugular with 23 cm Dialysis Catheter  . LIGATION OF ARTERIOVENOUS  FISTULA Left 08/08/2018   Procedure: LIGATION OF BACHIOCEPHALIC FISTULA;  Surgeon: Serafina Mitchell, MD;  Location: [place];  Service: Vascular;  Laterality: Left;  . NEPHRECTOMY TRANSPLANTED ORGAN Right   . REVISON OF ARTERIOVENOUS FISTULA Left 08/08/2018   Procedure: CREATION OF BRACHIOBASILIC FISTULA;  Surgeon: Serafina Mitchell, MD;  Location: [place];  Service: Vascular;  Laterality: Left;  . SHOULDER SURGERY Bilateral    20 years ago  . WISDOM TOOTH EXTRACTION      Allergies  Allergen Reactions  . Verapamil Other (See Comments)    Unknown reaction type Unknown reaction type was during a surgery    Current Outpatient Medications  Medication Sig Dispense Refill  . amLODipine (NORVASC) 10 MG tablet Take 10 mg by mouth every evening.     . calcium acetate (PHOSLO) 667 MG capsule Take 667-1,334 mg by mouth See admin instructions. Take 2 capsules (1334 mg) by mouth with each meal & take 1 capsule (667 mg) by mouth with a snack    . HYDROcodone-acetaminophen (NORCO) 5-325 MG tablet Take 1 tablet by mouth every 6 (six) hours as needed for moderate pain. 10 tablet 0  .  metoprolol tartrate (LOPRESSOR) 25 MG tablet Take 25 mg by mouth 2 (two) times daily.    . multivitamin (RENA-VIT) TABS tablet Take 1 tablet by mouth every evening.    . predniSONE (DELTASONE) 5 MG tablet Take 5 mg by mouth daily with breakfast.    . sulfamethoxazole-trimethoprim (BACTRIM DS) 800-160 MG per tablet Take 1 tablet by mouth every Monday, Wednesday, and Friday. In the morning.    . tacrolimus (PROGRAF) 1 MG capsule Take 4 mg by mouth 2 (two) times daily.    . tamsulosin (FLOMAX) 0.4 MG CAPS capsule Take 0.4 mg by mouth daily.      No current facility-administered medications for this visit.       REVIEW OF SYSTEMS: see HPI for pertinent positives and negatives    PHYSICAL EXAMINATION:  Vitals:   09/23/18 1220  BP: 129/89  Pulse: 86  Resp: 18  Temp: 98 F (36.7 C)  TempSrc: Temporal  SpO2: 99%  Weight: 184 lb (83.5 kg)  Height: 5\' 9"  (1.753 m)   Body mass index is 27.17 kg/m.  General: The patient appears his stated age.   HEENT:  No gross abnormalities Pulmonary: Respirations are non-labored Abdomen: Soft and non-tender  Musculoskeletal: There are no major deformities.   Neurologic: No focal weakness or paresthesias are detected Skin: There are no ulcer or rashes noted. Psychiatric: The patient has normal affect. Cardiovascular: There is a regular rate and rhythm without significant murmur appreciated.  Left radial pulse is 2+ palpable. Left upper arm AVF with palpable thrill distally, audible bruit distally and proximally.  TDC noted right IJ.   Non-Invasive Vascular Imaging  Left Upper arm Access Duplex  (Date: 09/23/2018):  Findings: +--------------------+----------+-----------------+--------+ AVF                 PSV (cm/s)Flow Vol (mL/min)Comments +--------------------+----------+-----------------+--------+ Native artery inflow   307          3174                +--------------------+----------+-----------------+--------+ AVF Anastomosis        517                              +--------------------+----------+-----------------+--------+  +------------+----------+-------------+----------+----------------+ OUTFLOW VEINPSV (cm/s)Diameter (cm)Depth (cm)    Describe     +------------+----------+-------------+----------+----------------+ Prox UA        210        1.26        1.25                    +------------+----------+-------------+----------+----------------+ Mid UA         310        1.19        0.60                    +------------+----------+-------------+----------+----------------+ Dist UA        258         1.27        0.49   competing branch +------------+----------+-------------+----------+----------------+ AC Fossa       448        1.18        0.29                    +------------+----------+-------------+----------+----------------+ Thrombosed cephalic vein noted from antecubital fossa to shoulder.    Summary: Patent arteriovenous fistula.   Medical Decision Making  Charles [name] is a 43  y.o. male who is s/p ligation of left brachiocephalic fistula and first stage left basilic vein fistula creation on 08-08-18 by Dr. Trula Slade.     I discussed with Dr. Trula Slade pertinent HPI, physical exam results, and results of today's left upper arm AVF duplex.   Schedule for 2nd stage  left basilic vein fistula with Dr. Trula Slade ASAP.    Clemon Chambers, RN, MSN, FNP-C [facility] Office: [phone]  09/23/2018, 12:59 PM  Clinic MD: Trula Slade

## 2018-09-23 NOTE — H&P (View-Only) (Signed)
CC: 6 weeks follow up AVF duplex  History of Present Illness  Charles [name] is a 43 y.o. (12/12/1975) male who is s/p ligation of left brachiocephalic fistula and first stage left basilic vein fistula creation on 08-08-18 by Dr. Trula Slade.  Pt had a large aneurysmal brachiocephalic fistula that had occluded but still had pulsatility proximally.  For that reason Dr. Trula Slade elected to stage his basilic vein so that hopefully some of the fullness in his upper arm could resolve before the second stage.   He returns today for 6 weeks AVF duplex.  He dialyzes MWF via right IJ TDC.   He denies steal type symptoms in his left upper extremity.     Past Medical History:  Diagnosis Date  . 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 Anemia of chronic kidney failure 07/30/2011  . Arteriovenous fistula ([place])   . Chronic kidney disease   . Chronic kidney disease (CKD), stage V ([place]) 05/03/2009   Qualifier: Diagnosis of  By: Al-Rammal, RVT, RDCS, Missy    . End-stage renal disease on hemodialysis ([place]) 08/09/2011  . History of renal transplant 11/27/2017  . Hyperparathyroidism ([place])   . Hypertension   . Metabolic bone disease   . Thrombocytopenia ([place]) 08/09/2011  . 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 Wears contact lenses     Social History Social History   Tobacco Use  . Smoking status: Never Smoker  . Smokeless tobacco: Never Used  Substance Use Topics  . Alcohol use: Not Currently    Comment: social  . Drug use: No    Family History Family History  Problem Relation Age of Onset  . Hypertension Mother   . Diabetes Mother   . Hypertension Father   . Cancer Maternal Grandmother        type unknown    Surgical History Past Surgical History:  Procedure Laterality Date  . AV FISTULA PLACEMENT  08/01/2011   Procedure: ARTERIOVENOUS (AV) FISTULA CREATION;  Surgeon: Elam Dutch, MD;  Location: [place];  Service: Vascular;  Laterality: Left;  . INSERTION OF DIALYSIS CATHETER   08/01/2011   Procedure: INSERTION OF DIALYSIS CATHETER;  Surgeon: Elam Dutch, MD;  Location: [place];  Service: Vascular;  Laterality: Right;  Right Internal Jugular with 23 cm Dialysis Catheter  . LIGATION OF ARTERIOVENOUS  FISTULA Left 08/08/2018   Procedure: LIGATION OF BACHIOCEPHALIC FISTULA;  Surgeon: Serafina Mitchell, MD;  Location: [place];  Service: Vascular;  Laterality: Left;  . NEPHRECTOMY TRANSPLANTED ORGAN Right   . REVISON OF ARTERIOVENOUS FISTULA Left 08/08/2018   Procedure: CREATION OF BRACHIOBASILIC FISTULA;  Surgeon: Serafina Mitchell, MD;  Location: [place];  Service: Vascular;  Laterality: Left;  . SHOULDER SURGERY Bilateral    20 years ago  . WISDOM TOOTH EXTRACTION      Allergies  Allergen Reactions  . Verapamil Other (See Comments)    Unknown reaction type Unknown reaction type was during a surgery    Current Outpatient Medications  Medication Sig Dispense Refill  . amLODipine (NORVASC) 10 MG tablet Take 10 mg by mouth every evening.     . calcium acetate (PHOSLO) 667 MG capsule Take 667-1,334 mg by mouth See admin instructions. Take 2 capsules (1334 mg) by mouth with each meal & take 1 capsule (667 mg) by mouth with a snack    . HYDROcodone-acetaminophen (NORCO) 5-325 MG tablet Take 1 tablet by mouth every 6 (six) hours as needed for moderate pain. 10 tablet 0  .  metoprolol tartrate (LOPRESSOR) 25 MG tablet Take 25 mg by mouth 2 (two) times daily.    . multivitamin (RENA-VIT) TABS tablet Take 1 tablet by mouth every evening.    . predniSONE (DELTASONE) 5 MG tablet Take 5 mg by mouth daily with breakfast.    . sulfamethoxazole-trimethoprim (BACTRIM DS) 800-160 MG per tablet Take 1 tablet by mouth every Monday, Wednesday, and Friday. In the morning.    . tacrolimus (PROGRAF) 1 MG capsule Take 4 mg by mouth 2 (two) times daily.    . tamsulosin (FLOMAX) 0.4 MG CAPS capsule Take 0.4 mg by mouth daily.      No current facility-administered medications for this visit.       REVIEW OF SYSTEMS: see HPI for pertinent positives and negatives    PHYSICAL EXAMINATION:  Vitals:   09/23/18 1220  BP: 129/89  Pulse: 86  Resp: 18  Temp: 98 F (36.7 C)  TempSrc: Temporal  SpO2: 99%  Weight: 184 lb (83.5 kg)  Height: 5\' 9"  (1.753 m)   Body mass index is 27.17 kg/m.  General: The patient appears his stated age.   HEENT:  No gross abnormalities Pulmonary: Respirations are non-labored Abdomen: Soft and non-tender  Musculoskeletal: There are no major deformities.   Neurologic: No focal weakness or paresthesias are detected Skin: There are no ulcer or rashes noted. Psychiatric: The patient has normal affect. Cardiovascular: There is a regular rate and rhythm without significant murmur appreciated.  Left radial pulse is 2+ palpable. Left upper arm AVF with palpable thrill distally, audible bruit distally and proximally.  TDC noted right IJ.   Non-Invasive Vascular Imaging  Left Upper arm Access Duplex  (Date: 09/23/2018):  Findings: +--------------------+----------+-----------------+--------+ AVF                 PSV (cm/s)Flow Vol (mL/min)Comments +--------------------+----------+-----------------+--------+ Native artery inflow   307          3174                +--------------------+----------+-----------------+--------+ AVF Anastomosis        517                              +--------------------+----------+-----------------+--------+  +------------+----------+-------------+----------+----------------+ OUTFLOW VEINPSV (cm/s)Diameter (cm)Depth (cm)    Describe     +------------+----------+-------------+----------+----------------+ Prox UA        210        1.26        1.25                    +------------+----------+-------------+----------+----------------+ Mid UA         310        1.19        0.60                    +------------+----------+-------------+----------+----------------+ Dist UA        258         1.27        0.49   competing branch +------------+----------+-------------+----------+----------------+ AC Fossa       448        1.18        0.29                    +------------+----------+-------------+----------+----------------+ Thrombosed cephalic vein noted from antecubital fossa to shoulder.    Summary: Patent arteriovenous fistula.   Medical Decision Making  Carrell Rahmani is a 43  y.o. male who is s/p ligation of left brachiocephalic fistula and first stage left basilic vein fistula creation on 08-08-18 by Dr. Trula Slade.     I discussed with Dr. Trula Slade pertinent HPI, physical exam results, and results of today's left upper arm AVF duplex.   Schedule for 2nd stage  left basilic vein fistula with Dr. Trula Slade ASAP.    Clemon Chambers, RN, MSN, FNP-C [facility] [place] Office: [phone]  09/23/2018, 12:59 PM  Clinic MD: Trula Slade

## 2018-09-23 NOTE — Telephone Encounter (Signed)
Patient called with new dates for nasal swab and surgery. Letter faxed to [facility]  with all instructions.

## 2018-10-01 ENCOUNTER — Other Ambulatory Visit: Payer: Self-pay

## 2018-10-01 ENCOUNTER — Encounter (HOSPITAL_COMMUNITY): Payer: Self-pay | Admitting: *Deleted

## 2018-10-01 NOTE — Progress Notes (Signed)
Pt denies SOB, chest pain, and being under the care of a cardiologist. Pt denies having a cardiac cath. Pt made aware to stop taking vitamins, fish oil and herbal medications. Do not take any NSAIDs ie: Ibuprofen, Advil, Naproxen (Aleve), Motrin, BC and Goody Powder.   Pt denies that he and family members tested positive for COVID-19 ( pt scheduled to be tested on 10/02/18 and reminded to quarantine).   Pt denies that he and family members experienced the following symptoms:  Cough yes/no: No Fever (>100.19F)  yes/no: No Runny nose yes/no: No Sore throat yes/no: No Difficulty breathing/shortness of breath  yes/no: No  Have you or a family member traveled in the last 14 days and where? yes/no: No  Pt reminded that hospital visitation restrictions are in effect and the importance of the restrictions.   Pt verbalized understanding of all pre-op instructions.  PA, Anesthesiology, asked to review EKG in [place].

## 2018-10-01 NOTE — Progress Notes (Signed)
Nurse requested EKG tracing from [facility]; awaiting response.

## 2018-10-02 ENCOUNTER — Other Ambulatory Visit (HOSPITAL_COMMUNITY)
Admission: RE | Admit: 2018-10-02 | Discharge: 2018-10-02 | Disposition: A | Discharge status: Home / Self Care | Payer: BC Managed Care – PPO | Source: Ambulatory Visit | Attending: Surgery | Admitting: Surgery

## 2018-10-02 DIAGNOSIS — Z1159 Encounter for screening for other viral diseases: Secondary | ICD-10-CM | POA: Insufficient documentation

## 2018-10-02 LAB — SARS CORONAVIRUS 2 (TAT 6-24 HRS): SARS Coronavirus 2: NEGATIVE

## 2018-10-02 NOTE — Anesthesia Preprocedure Evaluation (Addendum)
Anesthesia Evaluation  Patient identified by MRN, date of birth, ID band Patient awake    Reviewed: Allergy & Precautions, NPO status , Patient's Chart, lab work & pertinent test results  History of Anesthesia Complications Negative for: history of anesthetic complications  Airway Mallampati: I  TM Distance: >3 FB Neck ROM: Full    Dental  (+) Dental Advisory Given   Pulmonary  10/02/2018 SARS coronavirus NEG   breath sounds clear to auscultation       Cardiovascular hypertension, Pt. on medications and Pt. on home beta blockers (-) angina Rhythm:Regular Rate:Normal  '15 stress ECHO: Left ventricular diastolic function: Normal. no segmental wall motion abnormalities post exercise.normal increase in global LV function post exercise. The estimated LV ejection fraction is 60-65% with stress. Negative exercise echocardiography for inducible ischemia at target heart rate.   Neuro/Psych negative neurological ROS     GI/Hepatic negative GI ROS, Neg liver ROS,   Endo/Other  negative endocrine ROS  Renal/GU ESRF and DialysisRenal disease (MWF dialysis, K+ 4.8)     Musculoskeletal   Abdominal   Peds  Hematology negative hematology ROS (+)   Anesthesia Other Findings   Reproductive/Obstetrics                           Anesthesia Physical Anesthesia Plan  ASA: III  Anesthesia Plan: General   Post-op Pain Management:    Induction: Intravenous  PONV Risk Score and Plan: 2 and Treatment may vary due to age or medical condition, Ondansetron and Dexamethasone  Airway Management Planned: LMA  Additional Equipment:   Intra-op Plan:   Post-operative Plan:   Informed Consent: I have reviewed the patients History and Physical, chart, labs and discussed the procedure including the risks, benefits and alternatives for the proposed anesthesia with the patient or authorized representative who has  indicated his/her understanding and acceptance.     Dental advisory given  Plan Discussed with: CRNA and Surgeon  Anesthesia Plan Comments: (TTE 07/30/18 (care everywhere): SUMMARY The left ventricular size is normal. There is moderate concentric left ventricular hypertrophy.  Left ventricular systolic function is normal. LV ejection fraction = 55-60%. No segmental wall motion abnormalities seen in the left ventricle  Left ventricular filling pattern is prolonged relaxation. The right ventricle is normal in size and function. The left atrium is mildly dilated. There is mild tricuspid regurgitation. Mild pulmonic valvular regurgitation. IVC size was normal. There is no pericardial effusion. There is no comparison study available.  Dobutamine stress 07/30/18 (care everywhere): SUMMARY The patient had no chest pain during stress The patient achieved 67 % of maximum predicted heart rate. Nondiagnostic stress ECG due to subtarget heart rate. Incidentally noted is moderate to severe concentric LV hypertrophy There was normal augmentation of all left ventricular wall segments with  dobutamine. Negative dobutamine echocardiography for inducible ischemia at the  considerably subtarget heart rate that was achieved.)      Anesthesia Quick Evaluation

## 2018-10-03 ENCOUNTER — Other Ambulatory Visit: Payer: Self-pay

## 2018-10-03 ENCOUNTER — Ambulatory Visit (HOSPITAL_COMMUNITY)
Admission: RE | Admit: 2018-10-03 | Discharge: 2018-10-03 | Disposition: A | Discharge status: Home / Self Care | Payer: BC Managed Care – PPO | Attending: Surgery | Admitting: Surgery

## 2018-10-03 ENCOUNTER — Encounter (HOSPITAL_COMMUNITY): Payer: Self-pay | Admitting: *Deleted

## 2018-10-03 ENCOUNTER — Ambulatory Visit (HOSPITAL_COMMUNITY): Payer: BC Managed Care – PPO | Admitting: Physician Assistant

## 2018-10-03 ENCOUNTER — Encounter (HOSPITAL_COMMUNITY)
Admission: RE | Disposition: A | Discharge status: Home / Self Care | Payer: Self-pay | Source: Home / Self Care | Attending: Surgery

## 2018-10-03 DIAGNOSIS — I120 Hypertensive chronic kidney disease with stage 5 chronic kidney disease or end stage renal disease: Secondary | ICD-10-CM | POA: Insufficient documentation

## 2018-10-03 DIAGNOSIS — N185 Chronic kidney disease, stage 5: Secondary | ICD-10-CM | POA: Diagnosis not present

## 2018-10-03 DIAGNOSIS — Z992 Dependence on renal dialysis: Secondary | ICD-10-CM | POA: Insufficient documentation

## 2018-10-03 DIAGNOSIS — Z79899 Other long term (current) drug therapy: Secondary | ICD-10-CM | POA: Diagnosis not present

## 2018-10-03 DIAGNOSIS — D696 Thrombocytopenia, unspecified: Secondary | ICD-10-CM | POA: Insufficient documentation

## 2018-10-03 DIAGNOSIS — Z940 Kidney transplant status: Secondary | ICD-10-CM | POA: Diagnosis not present

## 2018-10-03 DIAGNOSIS — N186 End stage renal disease: Secondary | ICD-10-CM | POA: Diagnosis not present

## 2018-10-03 DIAGNOSIS — E213 Hyperparathyroidism, unspecified: Secondary | ICD-10-CM | POA: Insufficient documentation

## 2018-10-03 DIAGNOSIS — D631 Anemia in chronic kidney disease: Secondary | ICD-10-CM | POA: Diagnosis not present

## 2018-10-03 HISTORY — PX: BASCILIC VEIN TRANSPOSITION: SHX5742

## 2018-10-03 SURGERY — TRANSPOSITION, VEIN, BASILIC
Anesthesia: General | Site: Arm Upper | Laterality: Left

## 2018-10-03 MED ORDER — FENTANYL CITRATE (PF) 100 MCG/2ML IJ SOLN: 25.0000 ug | INTRAMUSCULAR | Status: DC | PRN

## 2018-10-03 MED ORDER — OXYCODONE HCL 5 MG PO TABS
ORAL_TABLET | ORAL | Status: AC
  Filled 2018-10-03: qty 1

## 2018-10-03 MED ORDER — CHLORHEXIDINE GLUCONATE 4 % EX LIQD: 60.0000 mL | Freq: Once | CUTANEOUS | Status: DC

## 2018-10-03 MED ORDER — PHENYLEPHRINE 40 MCG/ML (10ML) SYRINGE FOR IV PUSH (FOR BLOOD PRESSURE SUPPORT)
PREFILLED_SYRINGE | INTRAVENOUS | Status: AC
  Filled 2018-10-03: qty 10

## 2018-10-03 MED ORDER — 0.9 % SODIUM CHLORIDE (POUR BTL) OPTIME
TOPICAL | Status: DC | PRN
  Administered 2018-10-03: 1000 mL

## 2018-10-03 MED ORDER — HYDROCODONE-ACETAMINOPHEN 5-325 MG PO TABS: 1.0000 | ORAL_TABLET | Freq: Four times a day (QID) | ORAL | 0 refills | Status: DC | PRN

## 2018-10-03 MED ORDER — LIDOCAINE HCL (CARDIAC) PF 100 MG/5ML IV SOSY
PREFILLED_SYRINGE | INTRAVENOUS | Status: DC | PRN
  Administered 2018-10-03: 40 mg via INTRAVENOUS

## 2018-10-03 MED ORDER — MEPERIDINE HCL 25 MG/ML IJ SOLN: 6.2500 mg | INTRAMUSCULAR | Status: DC | PRN

## 2018-10-03 MED ORDER — PROMETHAZINE HCL 25 MG/ML IJ SOLN: 6.2500 mg | INTRAMUSCULAR | Status: DC | PRN

## 2018-10-03 MED ORDER — OXYCODONE HCL 5 MG PO TABS
5.0000 mg | ORAL_TABLET | Freq: Once | ORAL | Status: AC | PRN
  Administered 2018-10-03: 5 mg via ORAL

## 2018-10-03 MED ORDER — SODIUM CHLORIDE 0.9 % IV SOLN
INTRAVENOUS | Status: DC | PRN
  Administered 2018-10-03: 500 mL

## 2018-10-03 MED ORDER — CEFAZOLIN SODIUM-DEXTROSE 2-4 GM/100ML-% IV SOLN
2.0000 g | INTRAVENOUS | Status: AC
  Administered 2018-10-03: 2 g via INTRAVENOUS

## 2018-10-03 MED ORDER — MIDAZOLAM HCL 2 MG/2ML IJ SOLN: 0.5000 mg | Freq: Once | INTRAMUSCULAR | Status: DC | PRN

## 2018-10-03 MED ORDER — OXYCODONE HCL 5 MG/5ML PO SOLN: 5.0000 mg | Freq: Once | ORAL | Status: AC | PRN

## 2018-10-03 MED ORDER — DEXAMETHASONE SODIUM PHOSPHATE 10 MG/ML IJ SOLN
INTRAMUSCULAR | Status: AC
  Filled 2018-10-03: qty 1

## 2018-10-03 MED ORDER — FENTANYL CITRATE (PF) 100 MCG/2ML IJ SOLN
INTRAMUSCULAR | Status: DC | PRN
  Administered 2018-10-03 (×2): 25 ug via INTRAVENOUS
  Administered 2018-10-03 (×2): 50 ug via INTRAVENOUS
  Administered 2018-10-03 (×2): 25 ug via INTRAVENOUS

## 2018-10-03 MED ORDER — PROPOFOL 10 MG/ML IV BOLUS
INTRAVENOUS | Status: DC | PRN
  Administered 2018-10-03: 150 mg via INTRAVENOUS

## 2018-10-03 MED ORDER — ONDANSETRON HCL 4 MG/2ML IJ SOLN
INTRAMUSCULAR | Status: DC | PRN
  Administered 2018-10-03: 4 mg via INTRAVENOUS

## 2018-10-03 MED ORDER — CEFAZOLIN SODIUM-DEXTROSE 2-4 GM/100ML-% IV SOLN
INTRAVENOUS | Status: AC
  Filled 2018-10-03: qty 100

## 2018-10-03 MED ORDER — SODIUM CHLORIDE 0.9 % IV SOLN
INTRAVENOUS | Status: AC
  Filled 2018-10-03: qty 1.2

## 2018-10-03 MED ORDER — PROPOFOL 10 MG/ML IV BOLUS
INTRAVENOUS | Status: AC
  Filled 2018-10-03: qty 20

## 2018-10-03 MED ORDER — SODIUM CHLORIDE 0.9 % IV SOLN
INTRAVENOUS | Status: DC
  Administered 2018-10-03 (×2): via INTRAVENOUS

## 2018-10-03 MED ORDER — ONDANSETRON HCL 4 MG/2ML IJ SOLN
INTRAMUSCULAR | Status: AC
  Filled 2018-10-03: qty 2

## 2018-10-03 MED ORDER — SODIUM CHLORIDE 0.9 % IV SOLN
INTRAVENOUS | Status: DC | PRN
  Administered 2018-10-03: 25 ug/min via INTRAVENOUS

## 2018-10-03 MED ORDER — DEXAMETHASONE SODIUM PHOSPHATE 10 MG/ML IJ SOLN
INTRAMUSCULAR | Status: DC | PRN
  Administered 2018-10-03: 5 mg via INTRAVENOUS

## 2018-10-03 MED ORDER — LIDOCAINE-EPINEPHRINE (PF) 1 %-1:200000 IJ SOLN
INTRAMUSCULAR | Status: AC
  Filled 2018-10-03: qty 30

## 2018-10-03 MED ORDER — FENTANYL CITRATE (PF) 250 MCG/5ML IJ SOLN
INTRAMUSCULAR | Status: AC
  Filled 2018-10-03: qty 5

## 2018-10-03 SURGICAL SUPPLY — 36 items
ADH SKN CLS APL DERMABOND .7 (GAUZE/BANDAGES/DRESSINGS) ×2
ARMBAND PINK RESTRICT EXTREMIT (MISCELLANEOUS) ×2 IMPLANT
BNDG ELASTIC 4X5.8 VLCR STR LF (GAUZE/BANDAGES/DRESSINGS) ×1 IMPLANT
CANISTER SUCT 3000ML PPV (MISCELLANEOUS) ×2 IMPLANT
CLIP VESOCCLUDE MED 24/CT (CLIP) IMPLANT
CLIP VESOCCLUDE MED 6/CT (CLIP) ×1 IMPLANT
CLIP VESOCCLUDE SM WIDE 24/CT (CLIP) IMPLANT
CLIP VESOCCLUDE SM WIDE 6/CT (CLIP) IMPLANT
COVER PROBE W GEL 5X96 (DRAPES) ×2 IMPLANT
COVER WAND RF STERILE (DRAPES) ×1 IMPLANT
DERMABOND ADVANCED (GAUZE/BANDAGES/DRESSINGS) ×2
DERMABOND ADVANCED .7 DNX12 (GAUZE/BANDAGES/DRESSINGS) ×1 IMPLANT
ELECT REM PT RETURN 9FT ADLT (ELECTROSURGICAL) ×2
ELECTRODE REM PT RTRN 9FT ADLT (ELECTROSURGICAL) ×1 IMPLANT
GLOVE BIOGEL PI IND STRL 7.5 (GLOVE) ×1 IMPLANT
GLOVE BIOGEL PI INDICATOR 7.5 (GLOVE) ×1
GLOVE SURG SS PI 7.5 STRL IVOR (GLOVE) ×2 IMPLANT
GOWN STRL REUS W/ TWL LRG LVL3 (GOWN DISPOSABLE) ×2 IMPLANT
GOWN STRL REUS W/ TWL XL LVL3 (GOWN DISPOSABLE) ×1 IMPLANT
GOWN STRL REUS W/TWL LRG LVL3 (GOWN DISPOSABLE) ×4
GOWN STRL REUS W/TWL XL LVL3 (GOWN DISPOSABLE) ×4
HEMOSTAT SNOW SURGICEL 2X4 (HEMOSTASIS) IMPLANT
KIT BASIN OR (CUSTOM PROCEDURE TRAY) ×2 IMPLANT
KIT TURNOVER KIT B (KITS) ×2 IMPLANT
NS IRRIG 1000ML POUR BTL (IV SOLUTION) ×2 IMPLANT
PACK CV ACCESS (CUSTOM PROCEDURE TRAY) ×2 IMPLANT
PAD ARMBOARD 7.5X6 YLW CONV (MISCELLANEOUS) ×4 IMPLANT
SUT PROLENE 6 0 BV (SUTURE) ×2 IMPLANT
SUT PROLENE 6 0 CC (SUTURE) ×3 IMPLANT
SUT SILK 2 0 SH (SUTURE) ×1 IMPLANT
SUT VIC AB 3-0 SH 27 (SUTURE) ×6
SUT VIC AB 3-0 SH 27X BRD (SUTURE) ×1 IMPLANT
SUT VICRYL 4-0 PS2 18IN ABS (SUTURE) ×3 IMPLANT
TOWEL GREEN STERILE (TOWEL DISPOSABLE) ×2 IMPLANT
UNDERPAD 30X30 (UNDERPADS AND DIAPERS) ×2 IMPLANT
WATER STERILE IRR 1000ML POUR (IV SOLUTION) ×2 IMPLANT

## 2018-10-03 NOTE — Op Note (Signed)
    Patient name: ELIOR ROBINETTE MRN: [id number] DOB: [birth year] Sex: male  10/03/2018 Pre-operative Diagnosis: ESRD Post-operative diagnosis:  Same Surgeon:  Annamarie Major Assistants:  Ellsworth Lennox, RNFA Procedure:   Second stage left basilic vein fistula creation Anesthesia: General Blood Loss: 100 cc Specimens: None  Findings: 88-89 mm basilic vein.  This was fully mobilized and then re-tunneled after dividing the vein.  The venous anastomosis was at the antecubital crease.  A counterincision was made on the arm to make sure that the tunnel was widened to accommodate the large caliber vein.  Indications: The patient has previously undergone a for staged basilic vein fistula creation.  His vein has matured nicely and he comes in today for second stage.  Procedure:  The patient was identified in the holding area and taken to [place] [facility]  The patient was then placed supine on the table. general anesthesia was administered.  The patient was prepped and draped in the usual sterile fashion.  A time out was called and antibiotics were administered.  The left basilic vein was evaluated with ultrasound.  It was of excellent caliber, greater than 1 cm throughout.  2 longitudinal incisions were made in the upper arm.  Through these incisions the basilic vein was fully mobilized.  There were numerous branches that were ligated between silk ties.  The vein was mobilized from the antecubital crease up to the axilla.  It was then marked for orientation and divided.  A subcutaneous tunnel was then created with a curved tunneler.  The vein was then brought through the tunnel and cut to the appropriate length.  A end to end anastomosis was then created with a running 6-0 Prolene.  Prior to completion the appropriate flushing maneuvers were performed and the anastomosis was completed.  Once the clamps were released, there was an excellent thrill within the vein.  The vein was somewhat flattened up near the axilla  which made me think there may be too much compression on the vein because of his large caliber and the tunnel which was created with a 6 mm tunnel.  I therefore created a counterincision over top of the arc of the tunnel and expose the vein here and made sure that the tunnel was large enough to accommodate the vein.  I also had to repair 1 of the ties on a branch that had fallen off.  This was done with a vein pledget and 6-0 Prolene.  The wound was then irrigated.  Hemostasis was achieved.  The incisions were closed with 2 layers of Vicryl followed by Dermabond.  There were no immediate complications.   Disposition: To PACU stable.   Theotis Burrow, M.D., [facility] [place] Office: [phone] Pager:  [phone]

## 2018-10-03 NOTE — Interval H&P Note (Signed)
History and Physical Interval Note:  10/03/2018 12:04 PM  Charles [name]  has presented today for surgery, with the diagnosis of END STAGE RENAL DISEASE FOR HEMODIALYSIS ACCESS.  The various methods of treatment have been discussed with the patient and family. After consideration of risks, benefits and other options for treatment, the patient has consented to  Procedure(s): SECOND STAGE BASILIC VEIN TRANSPOSITION LEFT ARM (Left) as a surgical intervention.  The patient's history has been reviewed, patient examined, no change in status, stable for surgery.  I have reviewed the patient's chart and labs.  Questions were answered to the patient's satisfaction.     Annamarie Major

## 2018-10-03 NOTE — Anesthesia Postprocedure Evaluation (Signed)
Anesthesia Post Note  Patient: Charles [name]  Procedure(s) Performed: SECOND STAGE BASILIC VEIN TRANSPOSITION LEFT ARM (Left Arm Upper)     Patient location during evaluation: PACU Anesthesia Type: General Level of consciousness: awake and alert, oriented and patient cooperative Pain management: pain level controlled Vital Signs Assessment: post-procedure vital signs reviewed and stable Respiratory status: spontaneous breathing, nonlabored ventilation and respiratory function stable Cardiovascular status: blood pressure returned to baseline and stable Postop Assessment: no apparent nausea or vomiting Anesthetic complications: no    Last Vitals:  Vitals:   10/03/18 1644 10/03/18 1645  BP:  (!) 146/81  Pulse: 75 77  Resp: (!) 22 15  Temp:    SpO2: 97% 100%    Last Pain:  Vitals:   10/03/18 1641  TempSrc:   PainSc: 5                  Brenen Beigel,E. Markala Sitts

## 2018-10-03 NOTE — Anesthesia Procedure Notes (Signed)
Procedure Name: LMA Insertion Date/Time: 10/03/2018 1:08 PM Performed by: Inda Coke, CRNA Pre-anesthesia Checklist: Patient identified, Emergency Drugs available, Suction available and Patient being monitored Patient Re-evaluated:Patient Re-evaluated prior to induction Oxygen Delivery Method: Circle System Utilized Preoxygenation: Pre-oxygenation with 100% oxygen Induction Type: IV induction Ventilation: Mask ventilation without difficulty LMA: LMA inserted LMA Size: 4.0 Number of attempts: 1 Airway Equipment and Method: Bite block Placement Confirmation: positive ETCO2 Tube secured with: Tape Dental Injury: Teeth and Oropharynx as per pre-operative assessment

## 2018-10-03 NOTE — Discharge Instructions (Signed)
° °  [facility]  Discharge Instructions  AV Fistula or Graft Surgery for Dialysis Access  Please refer to the following instructions for your post-procedure care. Your surgeon or physician assistant will discuss any changes with you.  Activity  You may drive the day following your surgery, if you are comfortable and no longer taking prescription pain medication. Resume full activity as the soreness in your incision resolves.  Bathing/Showering  You may shower after you go home. Keep your incision dry for 48 hours. Do not soak in a bathtub, hot tub, or swim until the incision heals completely. You may not shower if you have a hemodialysis catheter.  Incision Care  Clean your incision with mild soap and water after 48 hours. Pat the area dry with a clean towel. You do not need a bandage unless otherwise instructed. Do not apply any ointments or creams to your incision. You may have skin glue on your incision. Do not peel it off. It will come off on its own in about one week. Your arm may swell a bit after surgery. To reduce swelling use pillows to elevate your arm so it is above your heart. Your doctor will tell you if you need to lightly wrap your arm with an ACE bandage.  Diet  Resume your normal diet. There are not special food restrictions following this procedure. In order to heal from your surgery, it is CRITICAL to get adequate nutrition. Your body requires vitamins, minerals, and protein. Vegetables are the best source of vitamins and minerals. Vegetables also provide the perfect balance of protein. Processed food has little nutritional value, so try to avoid this.  Medications  Resume taking all of your medications. If your incision is causing pain, you may take over-the counter pain relievers such as acetaminophen (Tylenol). If you were prescribed a stronger pain medication, please be aware these medications can cause nausea and constipation. Prevent  nausea by taking the medication with a snack or meal. Avoid constipation by drinking plenty of fluids and eating foods with high amount of fiber, such as fruits, vegetables, and grains.  Do not take Tylenol if you are taking prescription pain medications.  Follow up Your surgeon may want to see you in the office following your access surgery. If so, this will be arranged at the time of your surgery.  Please call us immediately for any of the following conditions:  Increased pain, redness, drainage (pus) from your incision site Fever of 101 degrees or higher Severe or worsening pain at your incision site Hand pain or numbness.  Reduce your risk of vascular disease:  Stop smoking. If you would like help, call [facility] at 1-800-QUIT-NOW (226) 065-8702) or [facility] [place] your cholesterol Maintain a desired weight Control your diabetes Keep your blood pressure down  Dialysis  It will take several weeks to several months for your new dialysis access to be ready for use. Your surgeon will determine when it is okay to use it. Your nephrologist will continue to direct your dialysis. You can continue to use your Permcath until your new access is ready for use.   10/03/2018 Charles [name] [id number] 08-29-1975  Surgeon(s): Serafina Mitchell, MD  Procedure(s): SECOND STAGE BASILIC VEIN TRANSPOSITION LEFT ARM  x Do not stick fistula for 6 weeks    If you have any questions, please call the office at [phone].

## 2018-10-03 NOTE — Transfer of Care (Signed)
Immediate Anesthesia Transfer of Care Note  Patient: Charles [name]  Procedure(s) Performed: SECOND STAGE BASILIC VEIN TRANSPOSITION LEFT ARM (Left Arm Upper)  Patient Location: PACU  Anesthesia Type:General  Level of Consciousness: awake, alert  and oriented  Airway & Oxygen Therapy: Patient Spontanous Breathing and Patient connected to nasal cannula oxygen  Post-op Assessment: Report given to RN and Post -op Vital signs reviewed and stable  Post vital signs: Reviewed and stable  Last Vitals:  Vitals Value Taken Time  BP 146/72 10/03/18 1529  Temp    Pulse 78 10/03/18 1531  Resp 15 10/03/18 1531  SpO2 96 % 10/03/18 1531  Vitals shown include unvalidated device data.  Last Pain:  Vitals:   10/03/18 1012  TempSrc:   PainSc: 0-No pain      Patients Stated Pain Goal: 0 (43/73/57 8978)  Complications: No apparent anesthesia complications

## 2018-10-04 ENCOUNTER — Encounter (HOSPITAL_COMMUNITY): Payer: Self-pay | Admitting: Surgery

## 2018-10-04 LAB — POCT I-STAT 4, (NA,K, GLUC, HGB,HCT)
Glucose, Bld: 93 mg/dL (ref 70–99)
HCT: 38 % — ABNORMAL LOW (ref 39.0–52.0)
Hemoglobin: 12.9 g/dL — ABNORMAL LOW (ref 13.0–17.0)
Potassium: 4.8 mmol/L (ref 3.5–5.1)
Sodium: 135 mmol/L (ref 135–145)

## 2018-10-23 ENCOUNTER — Other Ambulatory Visit (HOSPITAL_COMMUNITY): Payer: BC Managed Care – PPO

## 2018-10-28 ENCOUNTER — Other Ambulatory Visit: Payer: Self-pay

## 2018-10-28 ENCOUNTER — Ambulatory Visit (INDEPENDENT_AMBULATORY_CARE_PROVIDER_SITE_OTHER): Payer: Self-pay | Admitting: Physician Assistant

## 2018-10-28 VITALS — BP 129/89 | HR 93 | Temp 97.9°F | Resp 12 | Ht 69.0 in | Wt 187.5 lb

## 2018-10-28 DIAGNOSIS — Z992 Dependence on renal dialysis: Secondary | ICD-10-CM

## 2018-10-28 DIAGNOSIS — N186 End stage renal disease: Secondary | ICD-10-CM

## 2018-10-28 NOTE — Progress Notes (Signed)
    Postoperative Access Visit   History of Present Illness   Charles [name] is a 43 y.o. year old male who presents for postoperative follow-up for: left second stage basilic vein transposition by Dr. Trula Slade  (Date: 10/02/18).  The patient's wounds are healed.  The patient denies steal symptoms.  The patient is able to complete their activities of daily living.  He is dialyzing via R IJ TDC on MWF schedule under the care of Dr. Royce Macadamia.   Physical Examination   Vitals:   10/28/18 1429  BP: 129/89  Pulse: 93  Resp: 12  Temp: 97.9 F (36.6 C)  TempSrc: Temporal  SpO2: 100%  Weight: 187 lb 8 oz (85 kg)  Height: 5\' 9"  (1.753 m)   Body mass index is 27.69 kg/m.  left arm Incision is healed, hand grip is 5/5, sensation in digits is intact, palpable thrill, bruit can be auscultated, palpable L radial pulse     Medical Decision Making   Charles [name] is a 43 y.o. year old male who presents s/p left second stage basilic vein transposition   Patent fistula without signs or symptoms of steal syndrome  The patient's access will be ready for use 10/30/18  The patient's tunneled dialysis catheter can be removed when Nephrology is comfortable with the performance of the fistula  The patient may follow up on a prn basis   Charles Ligas PA-C [facility] [place] Office: [phone]  Clinic MD: Dr. Trula Slade

## 2018-12-17 ENCOUNTER — Ambulatory Visit: Payer: BC Managed Care – PPO | Admitting: Internal Medicine

## 2018-12-17 ENCOUNTER — Other Ambulatory Visit: Payer: Self-pay

## 2018-12-17 ENCOUNTER — Encounter: Payer: Self-pay | Admitting: Internal Medicine

## 2018-12-17 VITALS — BP 138/88 | HR 83 | Temp 98.2°F | Ht 69.0 in | Wt 188.4 lb

## 2018-12-17 DIAGNOSIS — E041 Nontoxic single thyroid nodule: Secondary | ICD-10-CM

## 2018-12-17 LAB — T4, FREE: Free T4: 0.93 ng/dL (ref 0.60–1.60)

## 2018-12-17 LAB — TSH: TSH: 0.47 u[IU]/mL (ref 0.35–4.50)

## 2018-12-17 NOTE — Patient Instructions (Signed)
-   Please stop by the lab today  - We will schedule you for another ultrasound, if you don;t hear about it in 2 weeks, please call our office back

## 2018-12-17 NOTE — Progress Notes (Signed)
Name: Charles [name]  MRN/ DOB: [id number], 1975/10/22    Age/ Sex: 43 y.o., male     PCP: Elmarie Shiley, MD   Reason for Endocrinology Evaluation: Thyroid Nodule      Initial Endocrinology Clinic Visit: 06/18/2018    PATIENT IDENTIFIER: [name] is a 43 y.o., male with a past medical history of ESRD Secondary to HTN (He is S/P renal transplant 2015). He has followed with [facility] since 06/18/2018 for consultative assistance with management of his thyroid nodule  HISTORICAL SUMMARY: The patient was first diagnosed with left calcified thyroid nodule during dialysis catheter placement in 2019.  SUBJECTIVE:   During last visit (06/18/2018): Ultrasound of the thyroid ordered  Today (12/17/2018):  Charles [name] is here for a 6 month follow up on thyroid nodule.  He denies any change in size of thyroid nodule. Denies dysphagia, hoarseness nor pain.  No weight changes, no change in bowel movements, no depression or anxiety      ROS:  As per HPI.   HISTORY:  Past Medical History:  Past Medical History:  Diagnosis Date  . 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 Anemia of chronic kidney failure 07/30/2011  . Arteriovenous fistula ([place])   . Chronic kidney disease   . Chronic kidney disease (CKD), stage V ([place]) 05/03/2009   Qualifier: Diagnosis of  By: Al-Rammal, RVT, RDCS, Missy    . End-stage renal disease on hemodialysis ([place]) 08/09/2011  . History of renal transplant 11/27/2017  . Hyperparathyroidism ([place])   . Hypertension   . Metabolic bone disease   . Thrombocytopenia ([place]) 08/09/2011  . 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 Wears contact lenses    Past Surgical History:  Past Surgical History:  Procedure Laterality Date  . AV FISTULA PLACEMENT  08/01/2011   Procedure: ARTERIOVENOUS (AV) FISTULA CREATION;  Surgeon: Elam Dutch, MD;  Location: [place];  Service: Vascular;  Laterality: Left;  . BASCILIC VEIN TRANSPOSITION Left 10/03/2018   Procedure: SECOND STAGE BASILIC VEIN TRANSPOSITION LEFT ARM;  Surgeon: Serafina Mitchell, MD;  Location: [place];  Service: Vascular;  Laterality: Left;  . INSERTION OF DIALYSIS CATHETER  08/01/2011   Procedure: INSERTION OF DIALYSIS CATHETER;  Surgeon: Elam Dutch, MD;  Location: [place];  Service: Vascular;  Laterality: Right;  Right Internal Jugular with 23 cm Dialysis Catheter  . LIGATION OF ARTERIOVENOUS  FISTULA Left 08/08/2018   Procedure: LIGATION OF BACHIOCEPHALIC FISTULA;  Surgeon: Serafina Mitchell, MD;  Location: [place];  Service: Vascular;  Laterality: Left;  . NEPHRECTOMY TRANSPLANTED ORGAN Right   . REVISON OF ARTERIOVENOUS FISTULA Left 08/08/2018   Procedure: CREATION OF BRACHIOBASILIC FISTULA;  Surgeon: Serafina Mitchell, MD;  Location: [place];  Service: Vascular;  Laterality: Left;  . SHOULDER SURGERY Bilateral    20 years ago  . WISDOM TOOTH EXTRACTION      Social History:  reports that he has never smoked. He has never used smokeless tobacco. He reports current alcohol use. He reports that he does not use drugs. Family History:  Family History  Problem Relation Age of Onset  . Hypertension Mother   . Diabetes Mother   . Hypertension Father   . Cancer Maternal Grandmother        type unknown     HOME MEDICATIONS: Allergies as of 12/17/2018      Reactions   Verapamil Other (See Comments)   Unknown reaction type Unknown reaction type was during a surgery  Medication List       Accurate as of December 17, 2018  8:58 AM. If you have any questions, ask your nurse or doctor.        amLODipine 10 MG tablet Commonly known as: NORVASC Take 10 mg by mouth every evening.   calcium acetate 667 MG capsule Commonly known as: PHOSLO Take 667-1,334 mg by mouth See admin instructions. Take 2 capsules (1334 mg) by mouth with each meal & take 1 capsule (667 mg) by mouth with a snack   metoprolol tartrate 25 MG tablet Commonly known as: LOPRESSOR Take 25 mg by mouth 2  (two) times daily.   multivitamin Tabs tablet Take 1 tablet by mouth every morning.   predniSONE 5 MG tablet Commonly known as: DELTASONE Take 5 mg by mouth daily with breakfast.   sulfamethoxazole-trimethoprim 800-160 MG per tablet Commonly known as: BACTRIM DS Take 1 tablet by mouth every Monday, Wednesday, and Friday. In the morning.   tacrolimus 1 MG capsule Commonly known as: PROGRAF Take 4 mg by mouth 2 (two) times daily.   tamsulosin 0.4 MG Caps capsule Commonly known as: FLOMAX Take 0.4 mg by mouth daily.         OBJECTIVE:   PHYSICAL EXAM: VS: BP 138/88 (BP Location: Right Arm, Patient Position: Sitting, Cuff Size: Normal)   Pulse 83   Temp 98.2 F (36.8 C)   Ht 5\' 9"  (1.753 m)   Wt 188 lb 6.4 oz (85.5 kg)   SpO2 97%   BMI 27.82 kg/m    EXAM: General: Pt appears well and is in NAD  Ears, Nose, Throat: Hearing: Grossly intact bilaterally Dental: Good dentition  Throat: Clear without mass, erythema or exudate  Neck: General: Supple without adenopathy. Thyroid: Thyroid size normal. Left nodule appreciated ~ 4.5 cm . No thyroid bruit.  Lungs: Clear with good BS bilat with no rales, rhonchi, or wheezes  Heart: Auscultation: RRR.  Abdomen: Normoactive bowel sounds, soft, nontender, without masses or organomegaly palpable  Extremities:  BL LE: No pretibial edema normal ROM and strength.  Skin: Hair: Texture and amount normal with gender appropriate distribution Skin Inspection: No rashes Skin Palpation: Skin temperature, texture, and thickness normal to palpation  Neuro: Cranial nerves: II - XII grossly intact  Cerebellar: Normal coordination and movement; no tremor Motor: Normal strength throughout DTRs: 2+ and symmetric in UE without delay in relaxation phase  Mental Status: Judgment, insight: Intact Mood and affect: No depression, anxiety, or agitation     DATA REVIEWED: Results for Charles [name], Charles [name] (MRN [id number]) as of 12/18/2018 07:28  Ref.  Range 12/17/2018 09:04  TSH Latest Ref Range: 0.35 - 4.50 uIU/mL 0.47  T4,Free(Direct) Latest Ref Range: 0.60 - 1.60 ng/dL 0.93       ASSESSMENT / PLAN / RECOMMENDATIONS:   1. Left thyroid nodule :  - Pt is  Clinically and biochemicaly euthyroid  - No local neck symptoms - He was schedule for a thyroid ultrasound in June but he did not make that appointment. He has been rescheduled again 10/5th at 2:40 (pt aware)   F/U in 1 yr    Signed electronically by: Mack Guise, MD  [facility]  [facility] [address] [place], [place] [address] Phone: [phone] FAX: [phone]      CC: Elmarie Shiley, [place] [address] Phone: [phone]  Fax: [phone]   Return to Endocrinology clinic as below: Future Appointments  Date Time Provider  [place]  12/23/2018  3:00 PM [facility] [place] 3 [facility]  12/23/2019  8:30 AM Bharath Bernstein, Melanie Crazier, MD [facility] None

## 2018-12-23 ENCOUNTER — Ambulatory Visit
Admission: RE | Admit: 2018-12-23 | Discharge: 2018-12-23 | Disposition: A | Discharge status: Home / Self Care | Payer: BC Managed Care – PPO | Source: Ambulatory Visit | Attending: Internal Medicine | Admitting: Internal Medicine

## 2018-12-23 DIAGNOSIS — E041 Nontoxic single thyroid nodule: Secondary | ICD-10-CM

## 2018-12-26 ENCOUNTER — Telehealth: Payer: Self-pay | Admitting: Internal Medicine

## 2018-12-26 DIAGNOSIS — E042 Nontoxic multinodular goiter: Secondary | ICD-10-CM

## 2018-12-26 NOTE — Telephone Encounter (Signed)
  Discussed thyroid ultrasound results as below       Nodule # 1:  Location: Isthmus;  Maximum size: 1.9 cm; Other 2 dimensions: 1.6 x 1.4 cm  Composition: mixed cystic and solid (1)  Echogenicity: isoechoic (1)  Shape: not taller-than-wide (0)  Margins: smooth (0)  Echogenic foci: none (0)  ACR TI-RADS total points: 2.  ACR TI-RADS risk category: TR2 (2 points).  ACR TI-RADS recommendations:  This nodule does NOT meet TI-RADS criteria for biopsy or dedicated follow-up.  _________________________________________________________  Nodule # 2:  Location: Isthmus;  Maximum size: 2.2 cm; Other 2 dimensions: 1.7 x 1.3 cm  Composition: solid/almost completely solid (2)  Echogenicity: isoechoic (1)  Shape: not taller-than-wide (0)  Margins: smooth (0)  Echogenic foci: none (0)  ACR TI-RADS total points: 3.  ACR TI-RADS risk category: TR3 (3 points).  ACR TI-RADS recommendations:  *Given size (>/= 1.5 - 2.4 cm) and appearance, a follow-up ultrasound in 1 year should be considered based on TI-RADS criteria.  _________________________________________________________  Nodule # 3:  Location: Right; Superior  Maximum size: 1.4 cm; Other 2 dimensions: 1.2 x 1 cm  Composition: mixed cystic and solid (1)  Echogenicity: isoechoic (1)  Shape: not taller-than-wide (0)  Margins: smooth (0)  Echogenic foci:  ACR TI-RADS total points: 2.  ACR TI-RADS risk category: TR2 (2 points).  ACR TI-RADS recommendations:  This nodule does NOT meet TI-RADS criteria for biopsy or dedicated follow-up.  _________________________________________________________  Nodule # 6:  Location: Right; Inferior  Maximum size: 3.1 cm; Other 2 dimensions: 2.2 x 1.2 cm  Composition: solid/almost completely solid (2)  Echogenicity: isoechoic (1)  Shape: not taller-than-wide (0)  Margins: smooth (0)  Echogenic foci: none  (0)  ACR TI-RADS total points: 3.  ACR TI-RADS risk category: TR3 (3 points).  ACR TI-RADS recommendations:  **Given size (>/= 2.5 cm) and appearance, fine needle aspiration of this mildly suspicious nodule should be considered based on TI-RADS criteria.  _________________________________________________________  There is a large primarily cystic nodule in the left thyroid gland measuring approximately 5.3 x 3.9 x 3.5 cm.  IMPRESSION: 1. Multinodular goiter as detailed above. The majority of the nodules identified do not meet criteria for biopsy or follow-up. 2. There is a 3.1 cm nodule in the inferior right thyroid gland (labeled #6) that meets criteria for fine-needle aspiration. 3. Large cystic nodule in the left thyroid gland which likely corresponds to the physical exam finding. This nodule is amenable to aspiration for symptom relief as clinically indicated. 4. There is a 2.2 cm nodule in the isthmus (labeled #2) that meet criteria for 1 year follow-up.  PLAN  Will proceed with right FNA nodule and left thyroid cyst drainage    Pt in agreement     [place], MD  [facility]  [facility] [address] [place], [place] [address] Phone: [phone] FAX: [phone]

## 2019-01-28 ENCOUNTER — Other Ambulatory Visit (HOSPITAL_COMMUNITY)
Admission: RE | Admit: 2019-01-28 | Discharge: 2019-01-28 | Disposition: A | Discharge status: Home / Self Care | Payer: BC Managed Care – PPO | Source: Ambulatory Visit | Attending: Internal Medicine | Admitting: Internal Medicine

## 2019-01-28 ENCOUNTER — Ambulatory Visit
Admission: RE | Admit: 2019-01-28 | Discharge: 2019-01-28 | Disposition: A | Discharge status: Home / Self Care | Payer: BC Managed Care – PPO | Source: Ambulatory Visit | Attending: Internal Medicine | Admitting: Internal Medicine

## 2019-01-28 DIAGNOSIS — E042 Nontoxic multinodular goiter: Secondary | ICD-10-CM | POA: Diagnosis not present

## 2019-01-28 NOTE — Procedures (Signed)
Interventional Radiology Procedure Note  Procedure: US guided biopsy of bilateral thyroid nodules Right #6 on last [place] study Left colloid cyst characteristics AFIRMA both lesions .  Complications: None  Recommendations:  - Ok to shower tomorrow - Do not submerge for 7 days - Routine care   Signed,  Dulcy Fanny. Earleen [place], DO

## 2019-01-29 LAB — CYTOLOGY - NON PAP

## 2019-01-31 ENCOUNTER — Telehealth: Payer: Self-pay | Admitting: Internal Medicine

## 2019-02-03 NOTE — Telephone Encounter (Signed)
Attempted to call the pt on 11/13 and again on 11/16 with no answer. Awaiting Afirma results

## 2019-02-05 NOTE — Telephone Encounter (Signed)
Patient returned your call. Please call patient at ph# [phone].

## 2019-02-06 NOTE — Telephone Encounter (Signed)
Spoke to the pt on 11/19 @ 1222 we discussed abnormal FNA on one nodule and insufficient cells on the other nodule , at this time we will wait on the afirma results and proceed from there.    Pt expressed understanding     Abby Nena Jordan, MD  [facility] Endocrinology  [facility] [address] [place], [place] [address] Phone: [phone] FAX: [phone]

## 2019-02-17 ENCOUNTER — Telehealth: Payer: Self-pay | Admitting: Internal Medicine

## 2019-02-17 NOTE — Telephone Encounter (Signed)
Discussed benign results of afirma of the right inferior nodule.         Large cystic nodule in the left thyroid gland which was amenable  to aspiration for symptom relief as clinically indicated.  Specimen Submitted: A. THYROID, LEFT LOBE, FINE NEEDLE ASPIRATION:  - Scant follicular epithelium present (Bethesda category I)    Left cystic nodule was insufficient cellularity- but this is mainly a cystic nodule, no need for repeat at this time.    Will f/u in 1 yr     Abby Nena Jordan, MD  [facility] Endocrinology  [facility] [address] [place], [place] [address] Phone: [phone] FAX: [phone]

## 2019-02-21 ENCOUNTER — Encounter (HOSPITAL_COMMUNITY): Payer: Self-pay

## 2019-03-27 ENCOUNTER — Telehealth: Payer: Self-pay

## 2019-03-27 NOTE — Telephone Encounter (Signed)
NOTES ON FILE FROM [facility] [phone], SENT REFERRAL TO SCHEDULING 

## 2019-04-08 ENCOUNTER — Telehealth: Payer: Self-pay

## 2019-04-08 NOTE — Telephone Encounter (Signed)
NOTES ON FILE FROM [facility] [phone], NOTES SENT TO SCHEDULING

## 2019-04-15 ENCOUNTER — Other Ambulatory Visit (HOSPITAL_COMMUNITY): Payer: Self-pay | Admitting: Nephrology

## 2019-04-15 DIAGNOSIS — Z01810 Encounter for preprocedural cardiovascular examination: Secondary | ICD-10-CM

## 2019-04-24 ENCOUNTER — Telehealth (HOSPITAL_COMMUNITY): Payer: Self-pay | Admitting: *Deleted

## 2019-04-24 NOTE — Telephone Encounter (Signed)
Patient given detailed instructions per Myocardial Perfusion Study Information Sheet for the test on 04/28/19 Patient notified to arrive 15 minutes early and that it is imperative to arrive on time for appointment to keep from having the test rescheduled.  If you need to cancel or reschedule your appointment, please call the office within 24 hours of your appointment. . Patient verbalized understanding. Kirstie Peri

## 2019-04-28 ENCOUNTER — Encounter (INDEPENDENT_AMBULATORY_CARE_PROVIDER_SITE_OTHER): Payer: Self-pay

## 2019-04-28 ENCOUNTER — Ambulatory Visit (HOSPITAL_COMMUNITY): Payer: BC Managed Care – PPO | Attending: Cardiovascular Disease

## 2019-04-28 ENCOUNTER — Other Ambulatory Visit: Payer: Self-pay

## 2019-04-28 DIAGNOSIS — Z01810 Encounter for preprocedural cardiovascular examination: Secondary | ICD-10-CM | POA: Diagnosis not present

## 2019-04-28 LAB — MYOCARDIAL PERFUSION IMAGING
LV dias vol: 169 mL (ref 62–150)
LV sys vol: 81 mL
Peak HR: 82 {beats}/min
Rest HR: 68 {beats}/min
SDS: 1
SRS: 1
SSS: 2
TID: 0.98

## 2019-04-28 MED ORDER — REGADENOSON 0.4 MG/5ML IV SOLN
0.4000 mg | Freq: Once | INTRAVENOUS | Status: AC
  Administered 2019-04-28: 0.4 mg via INTRAVENOUS

## 2019-04-28 MED ORDER — TECHNETIUM TC 99M TETROFOSMIN IV KIT
31.7000 | PACK | Freq: Once | INTRAVENOUS | Status: AC | PRN
  Administered 2019-04-28: 31.7 via INTRAVENOUS
  Filled 2019-04-28: qty 32

## 2019-04-28 MED ORDER — TECHNETIUM TC 99M TETROFOSMIN IV KIT
10.8000 | PACK | Freq: Once | INTRAVENOUS | Status: AC | PRN
  Administered 2019-04-28: 10.8 via INTRAVENOUS
  Filled 2019-04-28: qty 11

## 2019-12-22 ENCOUNTER — Ambulatory Visit: Payer: BC Managed Care – PPO | Admitting: Internal Medicine

## 2019-12-23 ENCOUNTER — Ambulatory Visit: Payer: BC Managed Care – PPO | Admitting: Internal Medicine

## 2021-06-20 IMAGING — US US FNA BIOPSY THYROID 1ST LESION
1 series · 13 of 25 positions shown · non-contrast
Comparison: 12/23/2018

MEDICATIONS:
None

COMPLICATIONS:
None

INDICATION: 43-year-old male with a history of thyroid nodules

EXAM:
ULTRASOUND GUIDED FINE NEEDLE ASPIRATION OF INDETERMINATE THYROID
NODULE
TECHNIQUE: Informed written consent was obtained from the patient after a
discussion of the risks, benefits and alternatives to treatment.
Questions regarding the procedure were encouraged and answered. A
timeout was performed prior to the initiation of the procedure.

[Series 1: us fna biopsy thyroid 1st lesion · 0.06mm/px · 27 acquisitions, 13 frames shown]
[im 1/27]
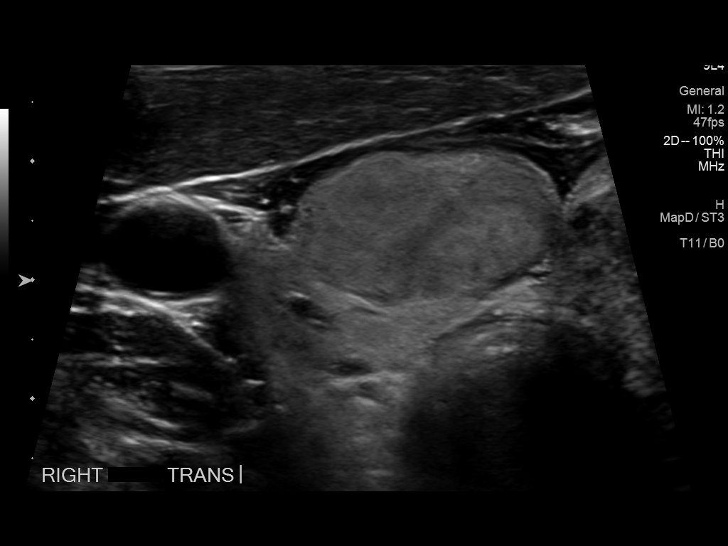
[im 3/27]
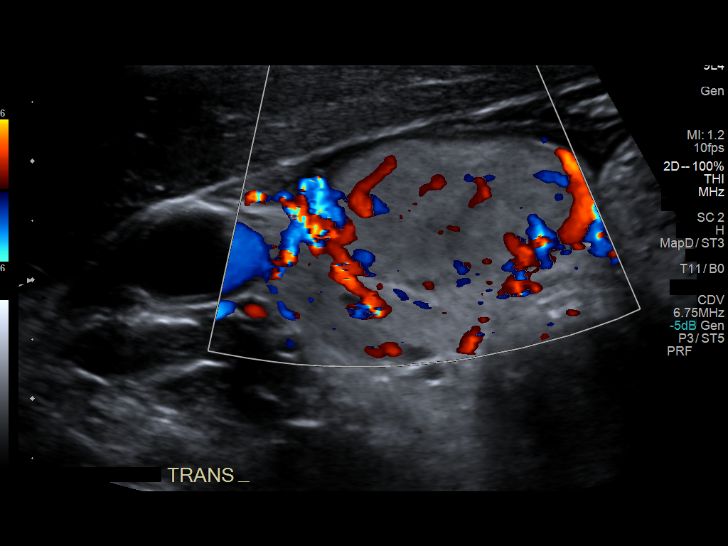
[im 5/27]
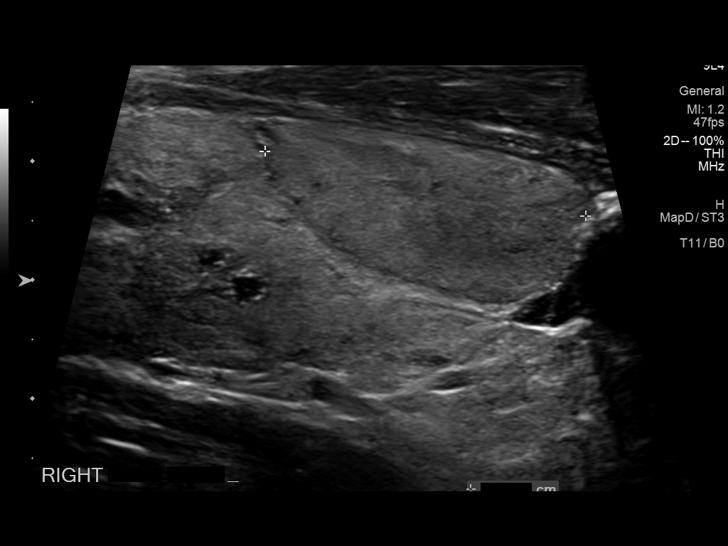
[im 7/27]
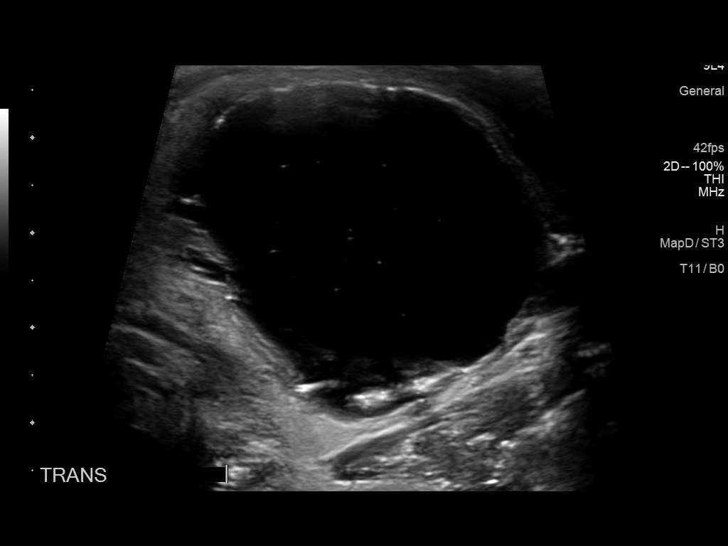
[im 9/27]
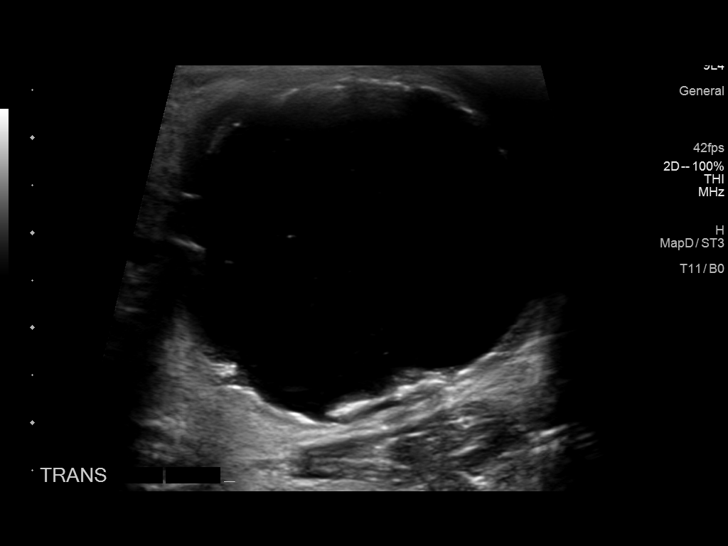
[im 11/27]
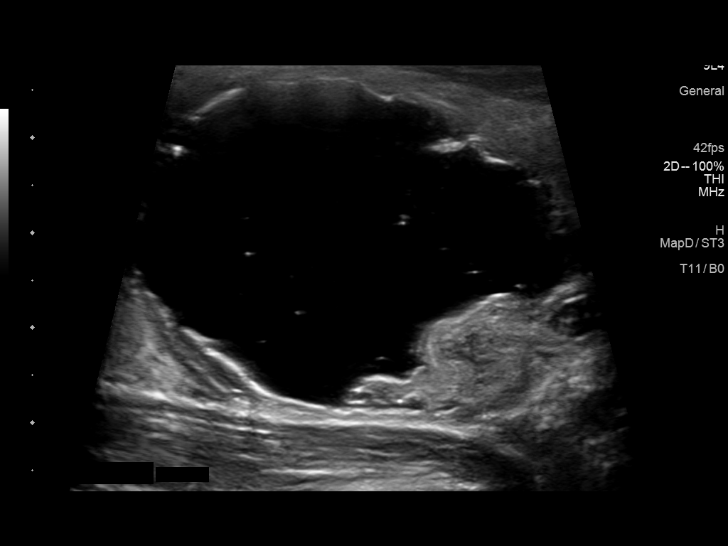
[im 14/27]
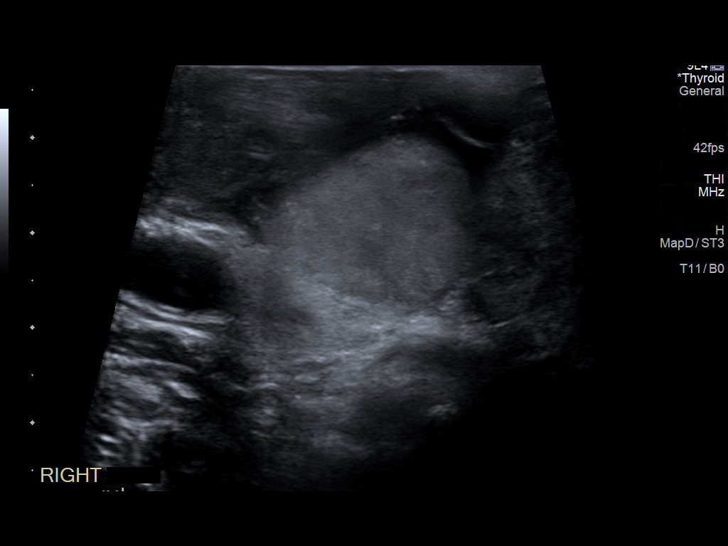
[im 16/27]
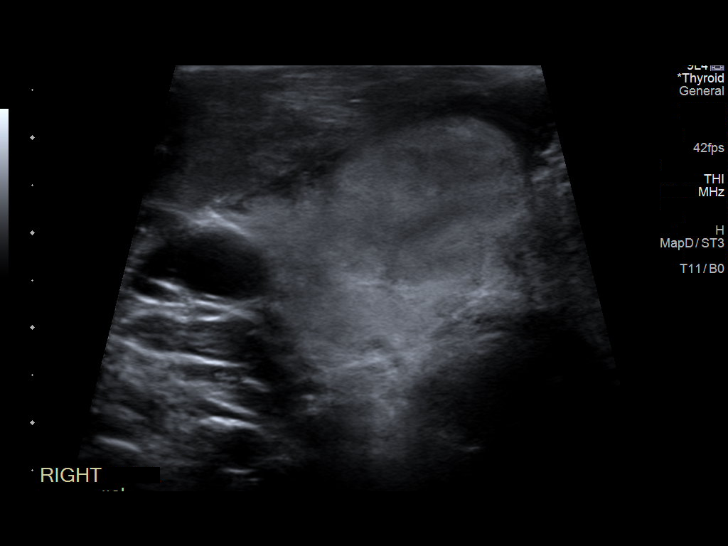
[im 18/27]
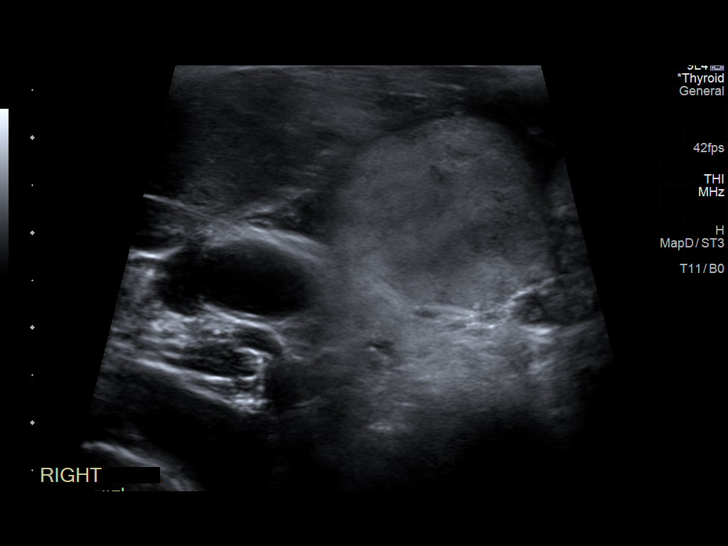
[im 20/27]
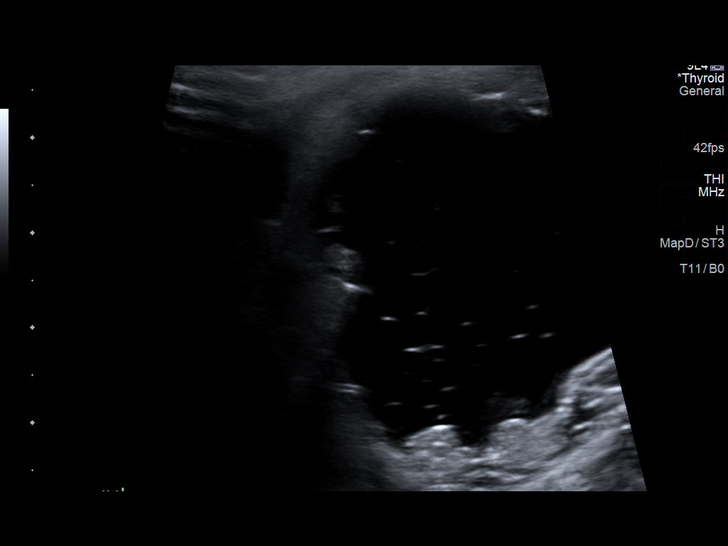
[im 22/27]
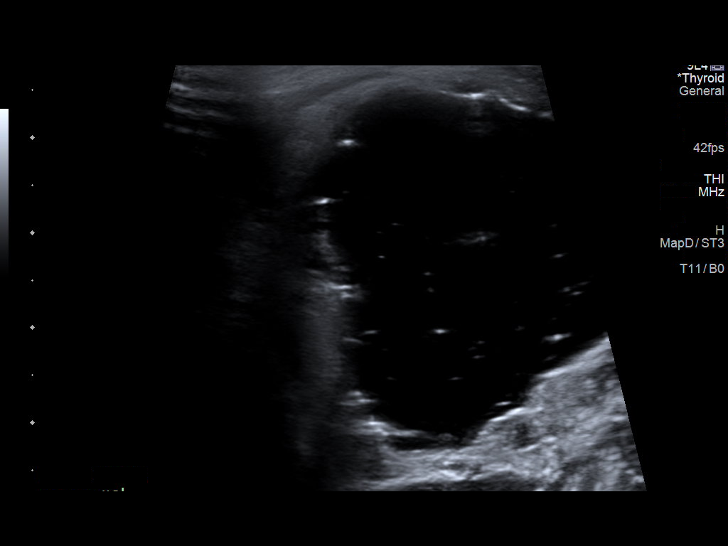
[im 24/27]
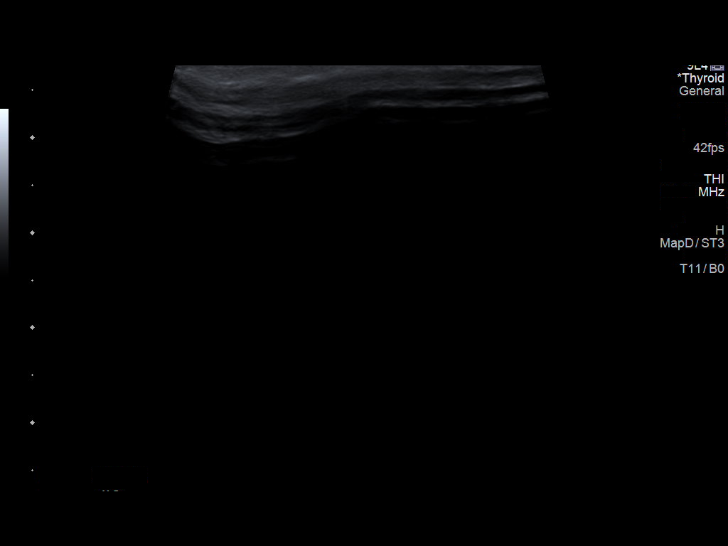
[im 27/27]
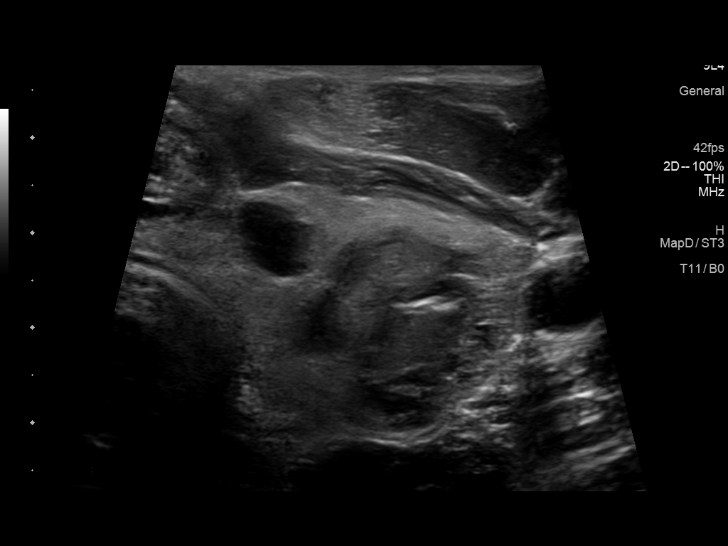

[13 of 25 positions shown; findings below may reference images not displayed]

Pre-procedural ultrasound scanning demonstrated unchanged size and
appearance of the indeterminate nodule within the right lower lobe,
6 mm, previously labeled 6

Left thyroid lobe, 5.3 cm, appears to be colloid cyst

The procedure was planned. The neck was prepped in the usual sterile
fashion, and a sterile drape was applied covering the operative
field. A timeout was performed prior to the initiation of the
procedure. Local anesthesia was provided with 1% lidocaine.

Under direct ultrasound guidance, 3 FNA biopsies were performed of
the right inferior thyroid nodule with a 25 gauge needle. Two
additional biopsies performed for Afirma testing. Multiple
ultrasound images were saved for procedural documentation purposes.
The samples were prepared and submitted to pathology.

Under direct ultrasound guidance, 3 FNA biopsies were performed of
the left thyroid cystic nodule with a 25 gauge needle. Two
additional biopsies performed for Afirma testing. Multiple
ultrasound images were saved for procedural documentation purposes.
The samples were prepared and submitted to pathology.

Limited post procedural scanning was negative for hematoma or
additional complication. Dressings were placed. The patient
tolerated the above procedures procedure well without immediate
postprocedural complication.
FINDINGS: FINDINGS
Nodule reference number based on prior diagnostic ultrasound: 6

Maximum size: 3.1 cm

Location: There;  Inferior

ACR TI-RADS total points: 3

ACR TI-RADS risk category:  TR3

Prior biopsy:  No

Reason for biopsy: meets ACR TI-RADS criteria

Ultrasound imaging confirms appropriate placement of the needles
within the thyroid nodule.

Nodule reference number based on prior diagnostic ultrasound: NA

Maximum size: 5.3 cm

Location: Left ;  Mid

ACR TI-RADS total points: NA

ACR TI-RADS risk category:  TR1

Prior biopsy:  No

Reason for biopsy: patient/referrer request

Ultrasound imaging confirms appropriate placement of the needles
within the thyroid nodule.
IMPRESSION: Status post ultrasound-guided biopsy of right inferior thyroid
nodule and left mid thyroid nodule.

## 2021-06-20 IMAGING — US US FNA BIOPSY THYROID 1ST LESION
1 series · 13 of 25 positions shown · non-contrast
Comparison: 12/23/2018

MEDICATIONS:
None

COMPLICATIONS:
None

INDICATION: 43-year-old male with a history of thyroid nodules

EXAM:
ULTRASOUND GUIDED FINE NEEDLE ASPIRATION OF INDETERMINATE THYROID
NODULE
TECHNIQUE: Informed written consent was obtained from the patient after a
discussion of the risks, benefits and alternatives to treatment.
Questions regarding the procedure were encouraged and answered. A
timeout was performed prior to the initiation of the procedure.

[Series 1: us fna biopsy thyroid 1st lesion · 0.06mm/px · 27 acquisitions, 13 frames shown]
[im 1/27]
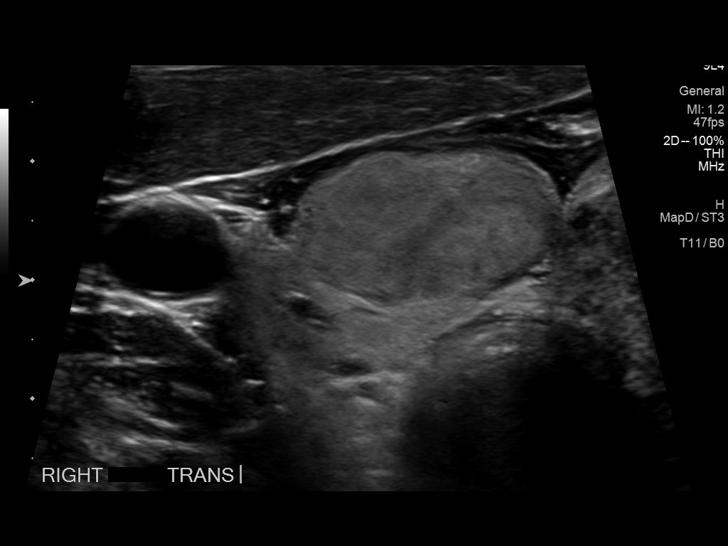
[im 3/27]
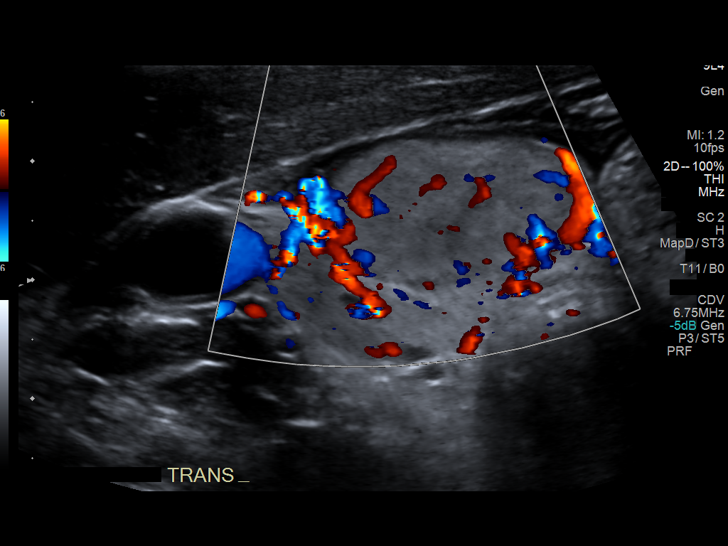
[im 5/27]
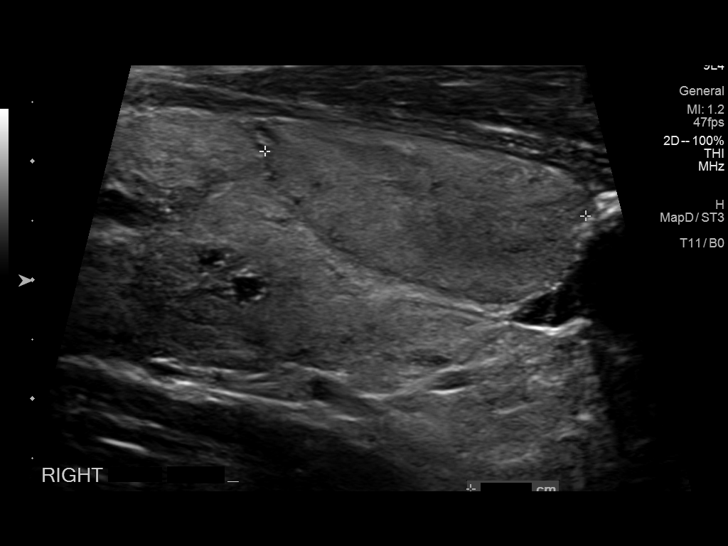
[im 7/27]
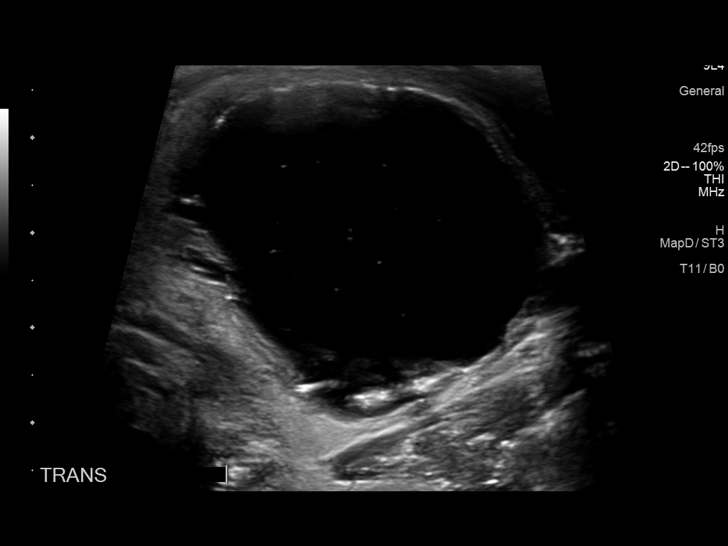
[im 9/27]
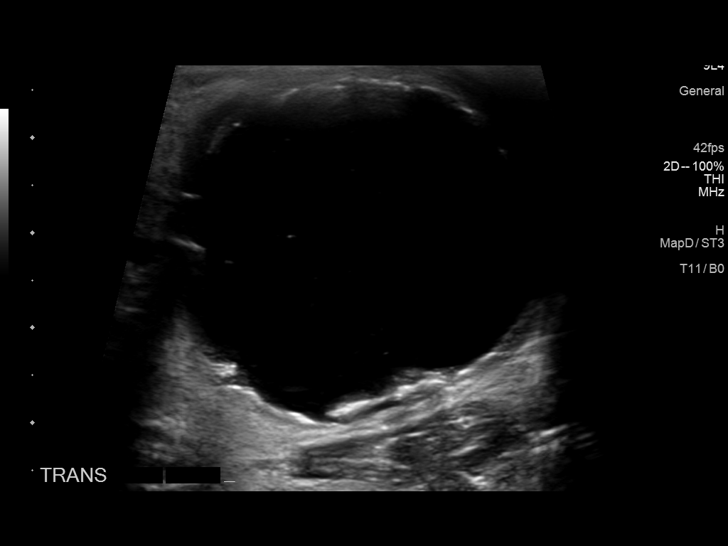
[im 11/27]
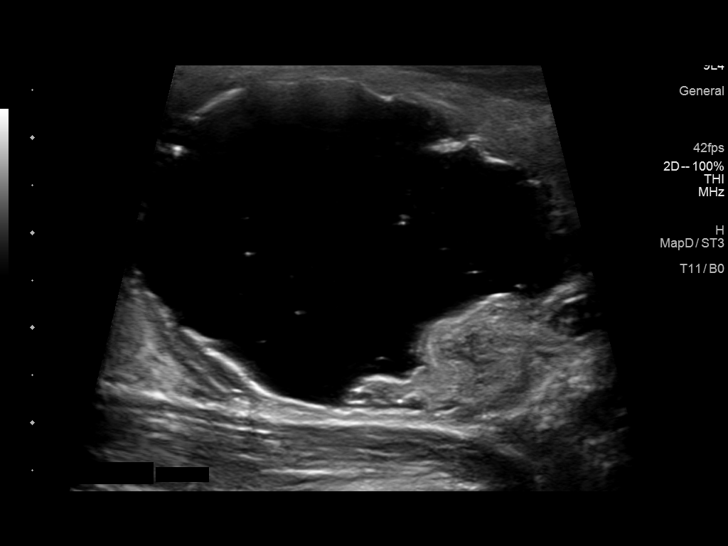
[im 14/27]
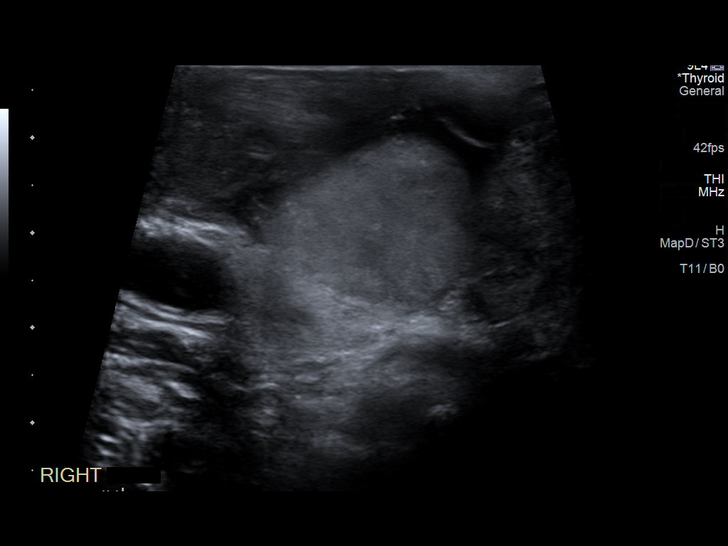
[im 16/27]
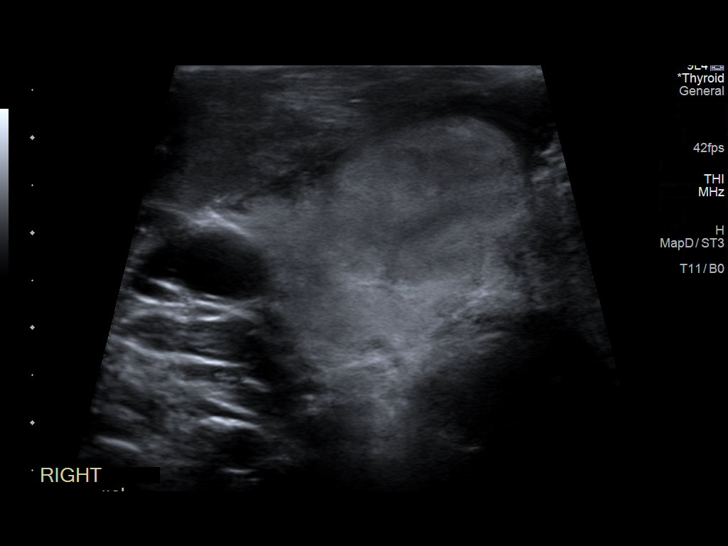
[im 18/27]
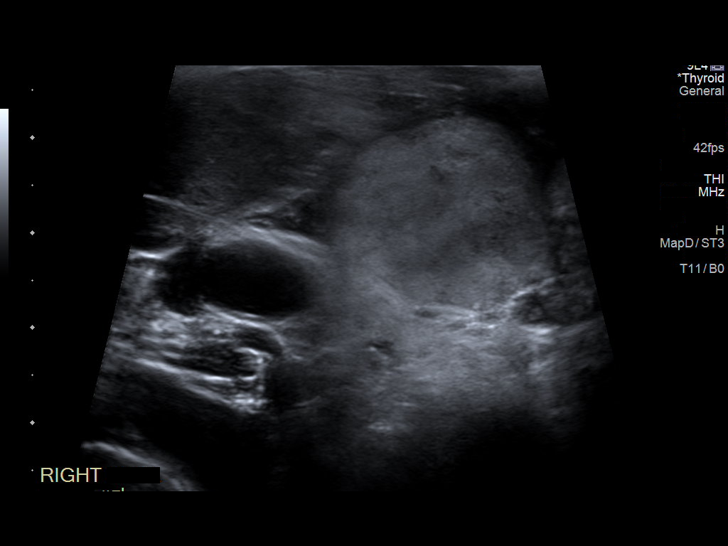
[im 20/27]
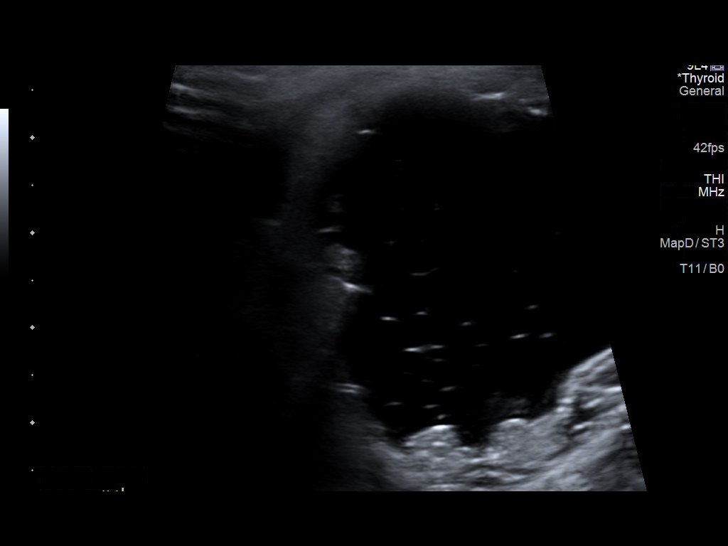
[im 22/27]
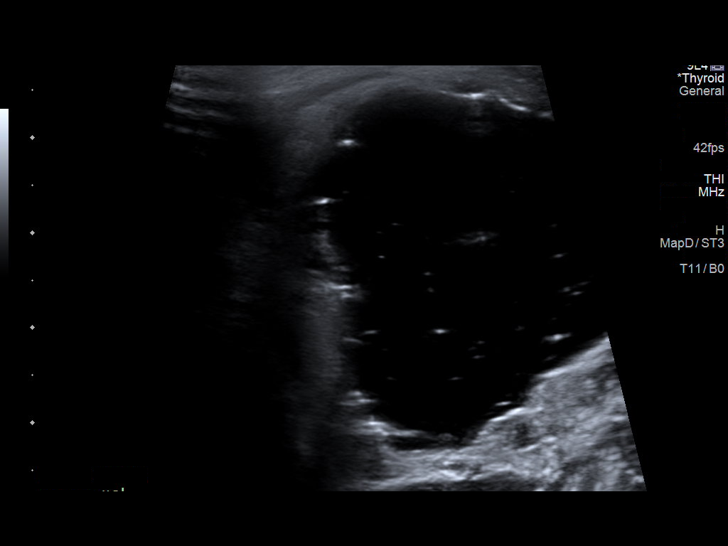
[im 24/27]
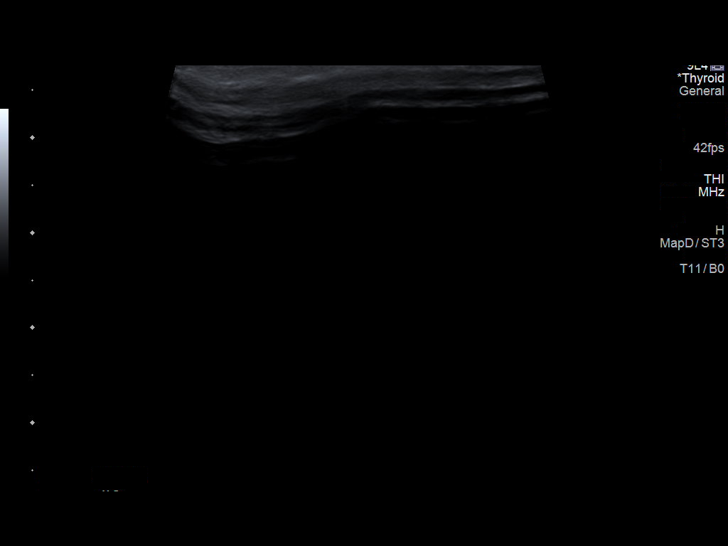
[im 27/27]
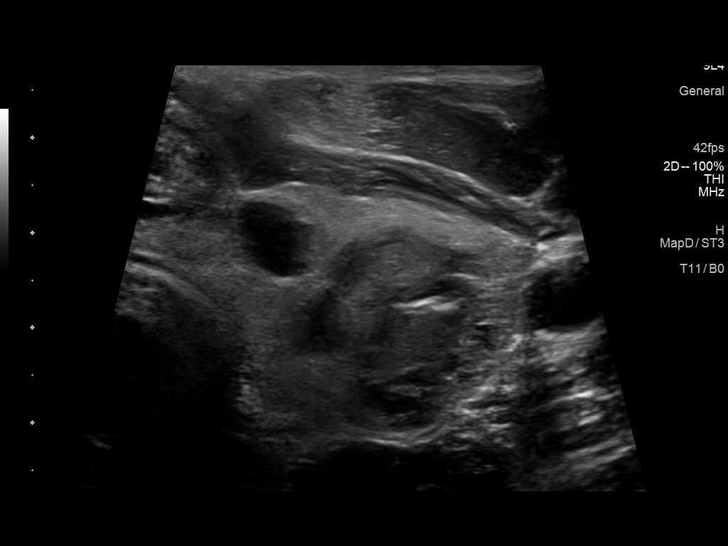

[13 of 25 positions shown; findings below may reference images not displayed]

Pre-procedural ultrasound scanning demonstrated unchanged size and
appearance of the indeterminate nodule within the right lower lobe,
6 mm, previously labeled 6

Left thyroid lobe, 5.3 cm, appears to be colloid cyst

The procedure was planned. The neck was prepped in the usual sterile
fashion, and a sterile drape was applied covering the operative
field. A timeout was performed prior to the initiation of the
procedure. Local anesthesia was provided with 1% lidocaine.

Under direct ultrasound guidance, 3 FNA biopsies were performed of
the right inferior thyroid nodule with a 25 gauge needle. Two
additional biopsies performed for Afirma testing. Multiple
ultrasound images were saved for procedural documentation purposes.
The samples were prepared and submitted to pathology.

Under direct ultrasound guidance, 3 FNA biopsies were performed of
the left thyroid cystic nodule with a 25 gauge needle. Two
additional biopsies performed for Afirma testing. Multiple
ultrasound images were saved for procedural documentation purposes.
The samples were prepared and submitted to pathology.

Limited post procedural scanning was negative for hematoma or
additional complication. Dressings were placed. The patient
tolerated the above procedures procedure well without immediate
postprocedural complication.
FINDINGS: FINDINGS
Nodule reference number based on prior diagnostic ultrasound: 6

Maximum size: 3.1 cm

Location: There;  Inferior

ACR TI-RADS total points: 3

ACR TI-RADS risk category:  TR3

Prior biopsy:  No

Reason for biopsy: meets ACR TI-RADS criteria

Ultrasound imaging confirms appropriate placement of the needles
within the thyroid nodule.

Nodule reference number based on prior diagnostic ultrasound: NA

Maximum size: 5.3 cm

Location: Left ;  Mid

ACR TI-RADS total points: NA

ACR TI-RADS risk category:  TR1

Prior biopsy:  No

Reason for biopsy: patient/referrer request

Ultrasound imaging confirms appropriate placement of the needles
within the thyroid nodule.
IMPRESSION: Status post ultrasound-guided biopsy of right inferior thyroid
nodule and left mid thyroid nodule.
